# Patient Record
Sex: Female | Born: 1980 | Race: White | Hispanic: No | Smoking: Current every day smoker
Health system: Southern US, Community
[De-identification: ages and names within clinical notes are randomized; demographics above are authoritative.]

## PROBLEM LIST (undated history)

## (undated) DIAGNOSIS — F419 Anxiety disorder, unspecified: Secondary | ICD-10-CM

## (undated) DIAGNOSIS — F909 Attention-deficit hyperactivity disorder, unspecified type: Secondary | ICD-10-CM

## (undated) HISTORY — PX: TUBAL LIGATION: SHX77

## (undated) HISTORY — PX: OTHER SURGICAL HISTORY: SHX169

---
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
## 2001-04-04 ENCOUNTER — Other Ambulatory Visit: Admission: RE | Admit: 2001-04-04 | Discharge: 2001-04-04 | Payer: Self-pay | Admitting: Obstetrics and Gynecology

## 2007-03-22 ENCOUNTER — Ambulatory Visit (HOSPITAL_COMMUNITY): Admission: RE | Admit: 2007-03-22 | Discharge: 2007-03-22 | Payer: Self-pay | Admitting: Family Medicine

## 2010-06-12 ENCOUNTER — Encounter: Payer: Self-pay | Admitting: Internal Medicine

## 2011-01-10 ENCOUNTER — Other Ambulatory Visit (HOSPITAL_COMMUNITY): Payer: Self-pay | Admitting: Internal Medicine

## 2011-01-12 ENCOUNTER — Other Ambulatory Visit (HOSPITAL_COMMUNITY): Payer: Self-pay | Admitting: Internal Medicine

## 2011-01-12 DIAGNOSIS — Z139 Encounter for screening, unspecified: Secondary | ICD-10-CM

## 2011-01-31 ENCOUNTER — Ambulatory Visit (HOSPITAL_COMMUNITY): Payer: Self-pay

## 2011-02-03 ENCOUNTER — Ambulatory Visit (HOSPITAL_COMMUNITY): Payer: Self-pay

## 2013-02-10 ENCOUNTER — Ambulatory Visit (HOSPITAL_COMMUNITY)
Admission: RE | Admit: 2013-02-10 | Discharge: 2013-02-10 | Disposition: A | Discharge status: Home / Self Care | Payer: Medicaid Other | Source: Ambulatory Visit | Attending: Internal Medicine | Admitting: Internal Medicine

## 2013-02-10 ENCOUNTER — Other Ambulatory Visit (HOSPITAL_COMMUNITY): Payer: Self-pay | Admitting: Internal Medicine

## 2013-02-10 DIAGNOSIS — M7732 Calcaneal spur, left foot: Secondary | ICD-10-CM

## 2013-02-10 DIAGNOSIS — M7730 Calcaneal spur, unspecified foot: Secondary | ICD-10-CM | POA: Insufficient documentation

## 2015-10-06 ENCOUNTER — Other Ambulatory Visit (HOSPITAL_COMMUNITY): Payer: Self-pay | Admitting: Internal Medicine

## 2015-10-06 ENCOUNTER — Ambulatory Visit (HOSPITAL_COMMUNITY)
Admission: RE | Admit: 2015-10-06 | Discharge: 2015-10-06 | Disposition: A | Discharge status: Home / Self Care | Payer: Medicaid Other | Source: Ambulatory Visit | Attending: Internal Medicine | Admitting: Internal Medicine

## 2015-10-06 DIAGNOSIS — M79673 Pain in unspecified foot: Secondary | ICD-10-CM | POA: Insufficient documentation

## 2015-10-06 DIAGNOSIS — M79671 Pain in right foot: Secondary | ICD-10-CM

## 2017-11-30 IMAGING — DX DG FOOT COMPLETE 3+V*R*
3 series · 3 of 3 positions shown · non-contrast
Comparison: No recent prior.

CLINICAL DATA: Right foot pain.  Initial evaluation.

EXAM:
RIGHT FOOT COMPLETE - 3+ VIEW

[foot ap]
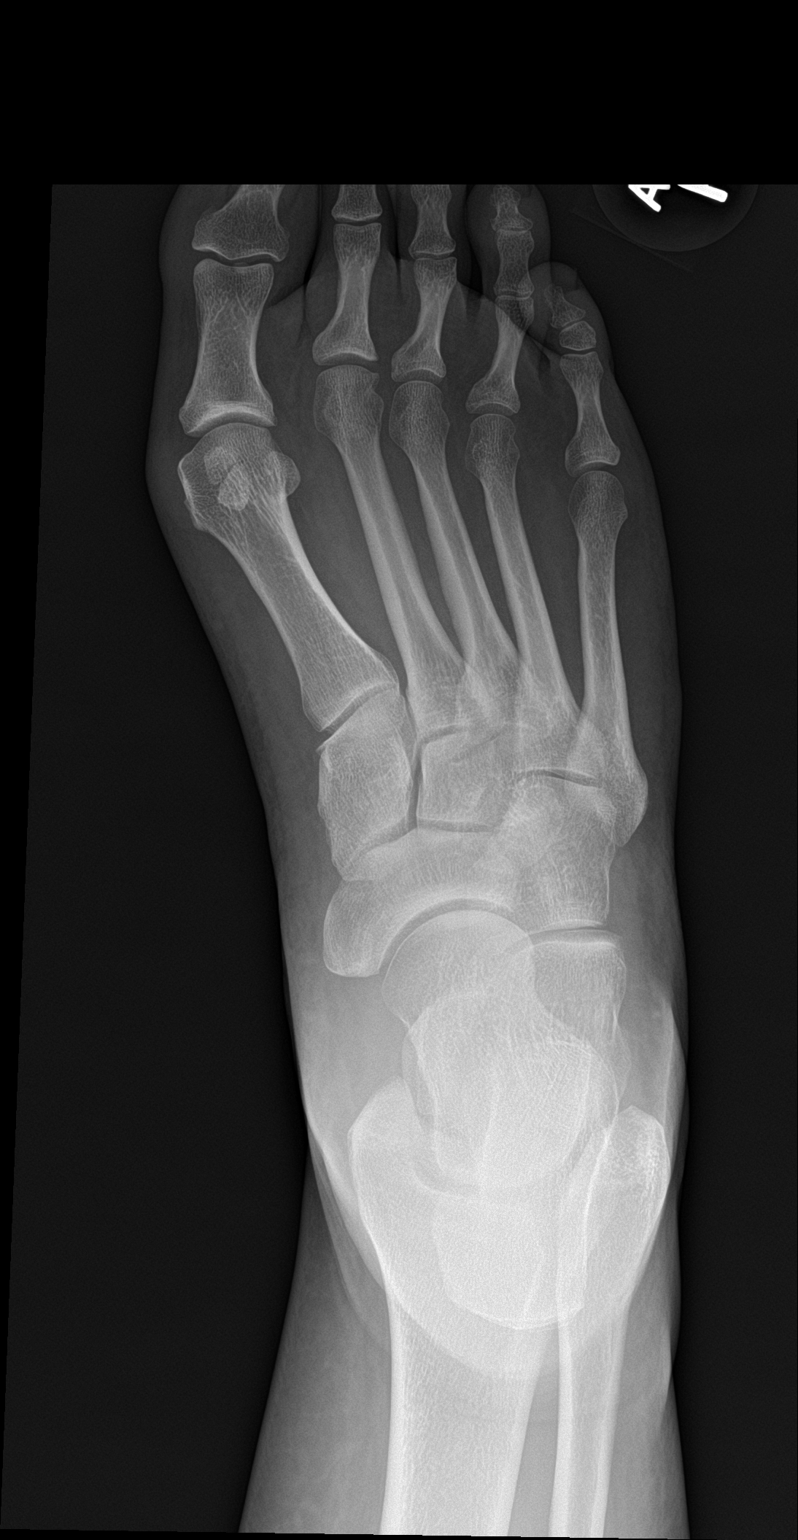

[foot obl]
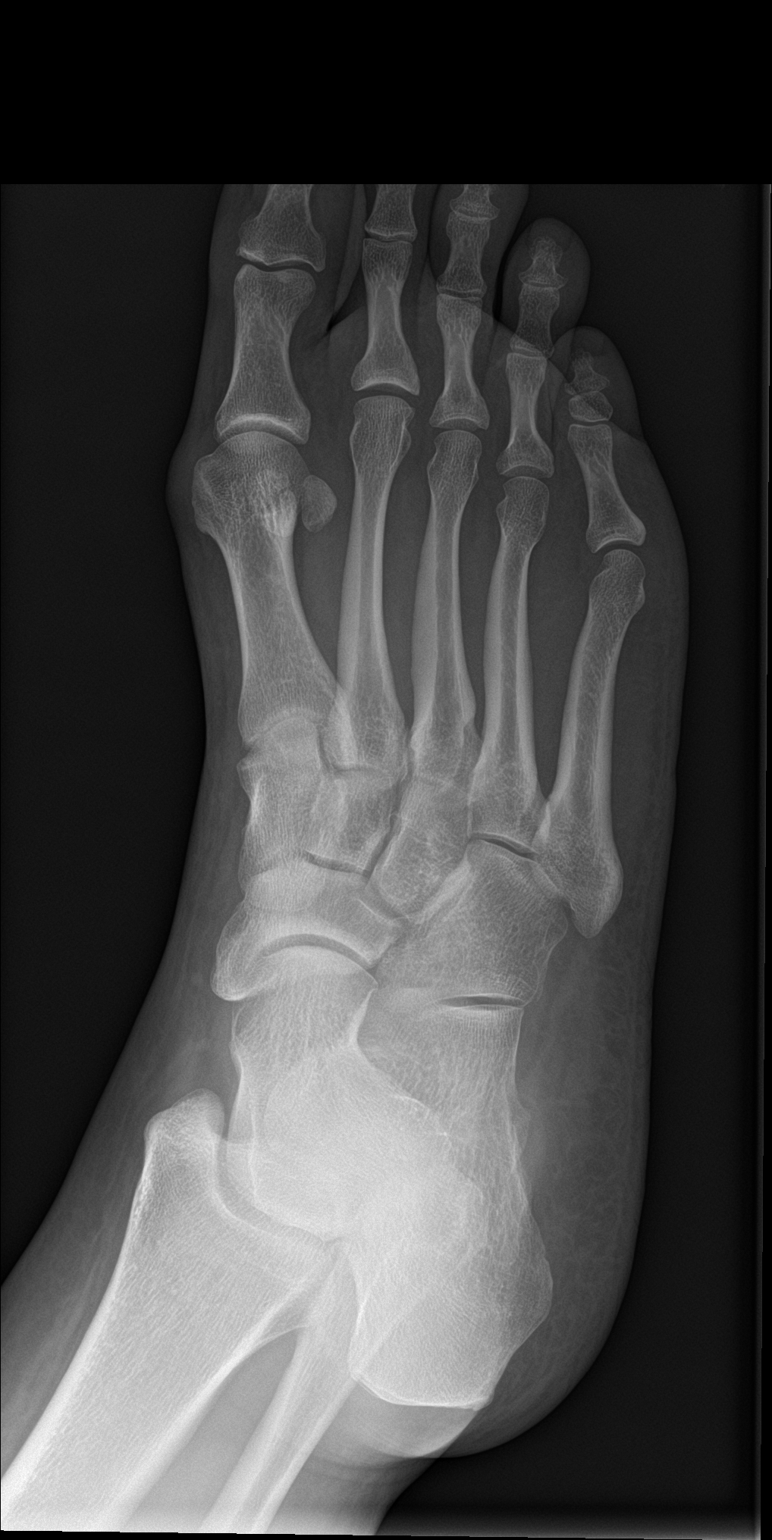

[foot lat]
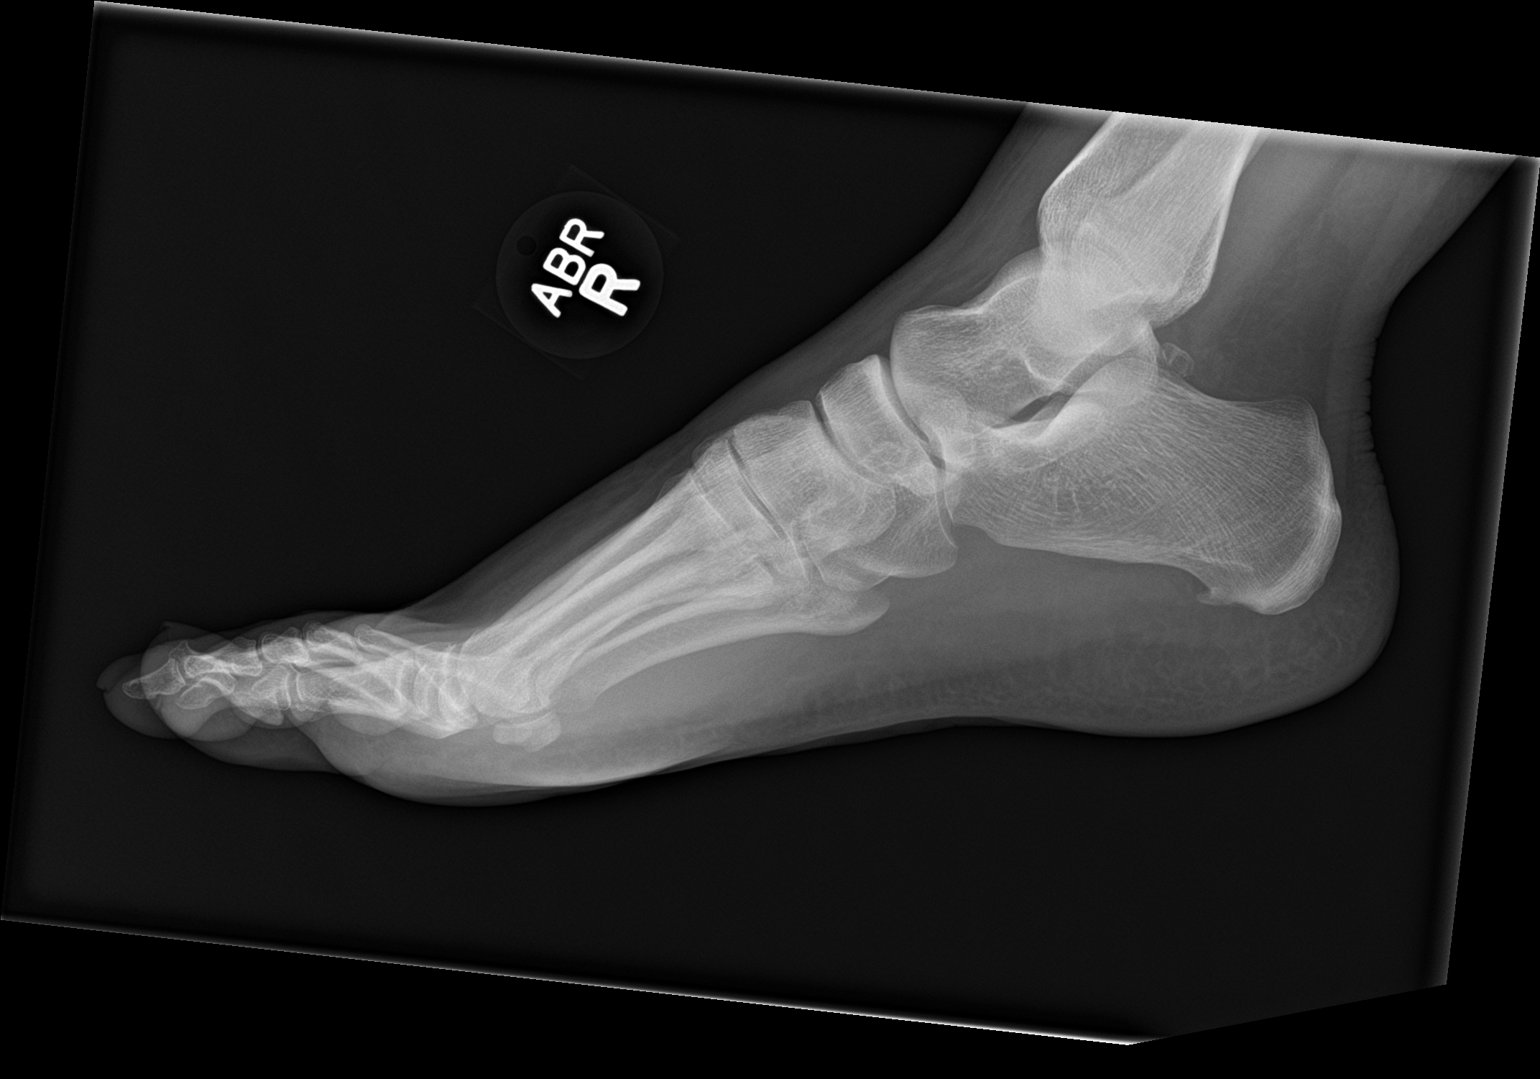

[3 of 3 positions shown; findings below may reference images not displayed]

FINDINGS: No acute bony or joint abnormality identified. No evidence of
fracture dislocation.
IMPRESSION: No acute abnormality.

## 2018-05-28 DIAGNOSIS — Z01411 Encounter for gynecological examination (general) (routine) with abnormal findings: Secondary | ICD-10-CM | POA: Diagnosis not present

## 2018-05-28 DIAGNOSIS — M779 Enthesopathy, unspecified: Secondary | ICD-10-CM | POA: Diagnosis not present

## 2018-05-28 DIAGNOSIS — G894 Chronic pain syndrome: Secondary | ICD-10-CM | POA: Diagnosis not present

## 2018-05-28 DIAGNOSIS — Z1389 Encounter for screening for other disorder: Secondary | ICD-10-CM | POA: Diagnosis not present

## 2018-05-28 DIAGNOSIS — E669 Obesity, unspecified: Secondary | ICD-10-CM | POA: Diagnosis not present

## 2018-05-28 DIAGNOSIS — K219 Gastro-esophageal reflux disease without esophagitis: Secondary | ICD-10-CM | POA: Diagnosis not present

## 2018-05-28 DIAGNOSIS — M7730 Calcaneal spur, unspecified foot: Secondary | ICD-10-CM | POA: Diagnosis not present

## 2018-05-28 DIAGNOSIS — Z6830 Body mass index (BMI) 30.0-30.9, adult: Secondary | ICD-10-CM | POA: Diagnosis not present

## 2018-05-28 DIAGNOSIS — F1120 Opioid dependence, uncomplicated: Secondary | ICD-10-CM | POA: Diagnosis not present

## 2018-05-28 DIAGNOSIS — F419 Anxiety disorder, unspecified: Secondary | ICD-10-CM | POA: Diagnosis not present

## 2018-05-29 DIAGNOSIS — Z124 Encounter for screening for malignant neoplasm of cervix: Secondary | ICD-10-CM | POA: Diagnosis not present

## 2018-06-28 DIAGNOSIS — M5417 Radiculopathy, lumbosacral region: Secondary | ICD-10-CM | POA: Diagnosis not present

## 2018-06-28 DIAGNOSIS — F329 Major depressive disorder, single episode, unspecified: Secondary | ICD-10-CM | POA: Diagnosis not present

## 2018-06-28 DIAGNOSIS — F419 Anxiety disorder, unspecified: Secondary | ICD-10-CM | POA: Diagnosis not present

## 2018-06-28 DIAGNOSIS — G894 Chronic pain syndrome: Secondary | ICD-10-CM | POA: Diagnosis not present

## 2018-06-28 DIAGNOSIS — Z6828 Body mass index (BMI) 28.0-28.9, adult: Secondary | ICD-10-CM | POA: Diagnosis not present

## 2018-06-28 DIAGNOSIS — F1120 Opioid dependence, uncomplicated: Secondary | ICD-10-CM | POA: Diagnosis not present

## 2018-06-28 DIAGNOSIS — Z1389 Encounter for screening for other disorder: Secondary | ICD-10-CM | POA: Diagnosis not present

## 2018-06-28 DIAGNOSIS — E669 Obesity, unspecified: Secondary | ICD-10-CM | POA: Diagnosis not present

## 2018-07-30 DIAGNOSIS — Z1389 Encounter for screening for other disorder: Secondary | ICD-10-CM | POA: Diagnosis not present

## 2018-07-30 DIAGNOSIS — M5124 Other intervertebral disc displacement, thoracic region: Secondary | ICD-10-CM | POA: Diagnosis not present

## 2018-07-30 DIAGNOSIS — Z6827 Body mass index (BMI) 27.0-27.9, adult: Secondary | ICD-10-CM | POA: Diagnosis not present

## 2018-07-30 DIAGNOSIS — M62838 Other muscle spasm: Secondary | ICD-10-CM | POA: Diagnosis not present

## 2018-07-30 DIAGNOSIS — G894 Chronic pain syndrome: Secondary | ICD-10-CM | POA: Diagnosis not present

## 2018-08-29 DIAGNOSIS — G894 Chronic pain syndrome: Secondary | ICD-10-CM | POA: Diagnosis not present

## 2018-08-29 DIAGNOSIS — E669 Obesity, unspecified: Secondary | ICD-10-CM | POA: Diagnosis not present

## 2018-08-29 DIAGNOSIS — F1120 Opioid dependence, uncomplicated: Secondary | ICD-10-CM | POA: Diagnosis not present

## 2018-08-29 DIAGNOSIS — Z1389 Encounter for screening for other disorder: Secondary | ICD-10-CM | POA: Diagnosis not present

## 2018-08-29 DIAGNOSIS — Z6830 Body mass index (BMI) 30.0-30.9, adult: Secondary | ICD-10-CM | POA: Diagnosis not present

## 2018-09-18 DIAGNOSIS — M5416 Radiculopathy, lumbar region: Secondary | ICD-10-CM | POA: Diagnosis not present

## 2018-10-24 DIAGNOSIS — M5416 Radiculopathy, lumbar region: Secondary | ICD-10-CM | POA: Diagnosis not present

## 2018-10-24 DIAGNOSIS — M545 Low back pain: Secondary | ICD-10-CM | POA: Diagnosis not present

## 2018-10-24 DIAGNOSIS — Z6830 Body mass index (BMI) 30.0-30.9, adult: Secondary | ICD-10-CM | POA: Diagnosis not present

## 2018-10-31 DIAGNOSIS — Z1389 Encounter for screening for other disorder: Secondary | ICD-10-CM | POA: Diagnosis not present

## 2018-10-31 DIAGNOSIS — Z6830 Body mass index (BMI) 30.0-30.9, adult: Secondary | ICD-10-CM | POA: Diagnosis not present

## 2018-10-31 DIAGNOSIS — E6609 Other obesity due to excess calories: Secondary | ICD-10-CM | POA: Diagnosis not present

## 2018-10-31 DIAGNOSIS — K219 Gastro-esophageal reflux disease without esophagitis: Secondary | ICD-10-CM | POA: Diagnosis not present

## 2018-10-31 DIAGNOSIS — M5126 Other intervertebral disc displacement, lumbar region: Secondary | ICD-10-CM | POA: Diagnosis not present

## 2018-10-31 DIAGNOSIS — G894 Chronic pain syndrome: Secondary | ICD-10-CM | POA: Diagnosis not present

## 2018-11-06 DIAGNOSIS — M545 Low back pain: Secondary | ICD-10-CM | POA: Diagnosis not present

## 2018-11-06 DIAGNOSIS — R293 Abnormal posture: Secondary | ICD-10-CM | POA: Diagnosis not present

## 2018-11-06 DIAGNOSIS — M5431 Sciatica, right side: Secondary | ICD-10-CM | POA: Diagnosis not present

## 2018-11-06 DIAGNOSIS — M5416 Radiculopathy, lumbar region: Secondary | ICD-10-CM | POA: Diagnosis not present

## 2018-11-07 DIAGNOSIS — R293 Abnormal posture: Secondary | ICD-10-CM | POA: Diagnosis not present

## 2018-11-07 DIAGNOSIS — M5431 Sciatica, right side: Secondary | ICD-10-CM | POA: Diagnosis not present

## 2018-11-07 DIAGNOSIS — M545 Low back pain: Secondary | ICD-10-CM | POA: Diagnosis not present

## 2018-11-07 DIAGNOSIS — M5416 Radiculopathy, lumbar region: Secondary | ICD-10-CM | POA: Diagnosis not present

## 2018-11-14 DIAGNOSIS — M545 Low back pain: Secondary | ICD-10-CM | POA: Diagnosis not present

## 2018-11-14 DIAGNOSIS — M5416 Radiculopathy, lumbar region: Secondary | ICD-10-CM | POA: Diagnosis not present

## 2018-11-14 DIAGNOSIS — M5431 Sciatica, right side: Secondary | ICD-10-CM | POA: Diagnosis not present

## 2018-11-14 DIAGNOSIS — R293 Abnormal posture: Secondary | ICD-10-CM | POA: Diagnosis not present

## 2018-12-02 DIAGNOSIS — G894 Chronic pain syndrome: Secondary | ICD-10-CM | POA: Diagnosis not present

## 2018-12-02 DIAGNOSIS — M797 Fibromyalgia: Secondary | ICD-10-CM | POA: Diagnosis not present

## 2018-12-02 DIAGNOSIS — Z1389 Encounter for screening for other disorder: Secondary | ICD-10-CM | POA: Diagnosis not present

## 2018-12-02 DIAGNOSIS — Z6831 Body mass index (BMI) 31.0-31.9, adult: Secondary | ICD-10-CM | POA: Diagnosis not present

## 2018-12-02 DIAGNOSIS — F1120 Opioid dependence, uncomplicated: Secondary | ICD-10-CM | POA: Diagnosis not present

## 2019-01-03 DIAGNOSIS — G894 Chronic pain syndrome: Secondary | ICD-10-CM | POA: Diagnosis not present

## 2019-01-03 DIAGNOSIS — Z6831 Body mass index (BMI) 31.0-31.9, adult: Secondary | ICD-10-CM | POA: Diagnosis not present

## 2019-01-03 DIAGNOSIS — M7730 Calcaneal spur, unspecified foot: Secondary | ICD-10-CM | POA: Diagnosis not present

## 2019-01-03 DIAGNOSIS — M5136 Other intervertebral disc degeneration, lumbar region: Secondary | ICD-10-CM | POA: Diagnosis not present

## 2019-02-04 DIAGNOSIS — K219 Gastro-esophageal reflux disease without esophagitis: Secondary | ICD-10-CM | POA: Diagnosis not present

## 2019-02-04 DIAGNOSIS — M797 Fibromyalgia: Secondary | ICD-10-CM | POA: Diagnosis not present

## 2019-02-04 DIAGNOSIS — G894 Chronic pain syndrome: Secondary | ICD-10-CM | POA: Diagnosis not present

## 2019-02-04 DIAGNOSIS — Z6832 Body mass index (BMI) 32.0-32.9, adult: Secondary | ICD-10-CM | POA: Diagnosis not present

## 2019-03-06 DIAGNOSIS — E6609 Other obesity due to excess calories: Secondary | ICD-10-CM | POA: Diagnosis not present

## 2019-03-06 DIAGNOSIS — Z6832 Body mass index (BMI) 32.0-32.9, adult: Secondary | ICD-10-CM | POA: Diagnosis not present

## 2019-03-06 DIAGNOSIS — K219 Gastro-esophageal reflux disease without esophagitis: Secondary | ICD-10-CM | POA: Diagnosis not present

## 2019-03-06 DIAGNOSIS — G894 Chronic pain syndrome: Secondary | ICD-10-CM | POA: Diagnosis not present

## 2019-04-03 DIAGNOSIS — G894 Chronic pain syndrome: Secondary | ICD-10-CM | POA: Diagnosis not present

## 2019-04-30 DIAGNOSIS — G894 Chronic pain syndrome: Secondary | ICD-10-CM | POA: Diagnosis not present

## 2021-03-23 DIAGNOSIS — F329 Major depressive disorder, single episode, unspecified: Secondary | ICD-10-CM | POA: Diagnosis not present

## 2021-03-23 DIAGNOSIS — Z6821 Body mass index (BMI) 21.0-21.9, adult: Secondary | ICD-10-CM | POA: Diagnosis not present

## 2021-03-23 DIAGNOSIS — Z13220 Encounter for screening for lipoid disorders: Secondary | ICD-10-CM | POA: Diagnosis not present

## 2021-03-23 DIAGNOSIS — Z9229 Personal history of other drug therapy: Secondary | ICD-10-CM | POA: Diagnosis not present

## 2021-03-23 DIAGNOSIS — G894 Chronic pain syndrome: Secondary | ICD-10-CM | POA: Diagnosis not present

## 2021-03-23 DIAGNOSIS — K219 Gastro-esophageal reflux disease without esophagitis: Secondary | ICD-10-CM | POA: Diagnosis not present

## 2021-03-23 DIAGNOSIS — R634 Abnormal weight loss: Secondary | ICD-10-CM | POA: Diagnosis not present

## 2021-03-23 DIAGNOSIS — F419 Anxiety disorder, unspecified: Secondary | ICD-10-CM | POA: Diagnosis not present

## 2021-04-21 DIAGNOSIS — F419 Anxiety disorder, unspecified: Secondary | ICD-10-CM | POA: Diagnosis not present

## 2021-04-21 DIAGNOSIS — G894 Chronic pain syndrome: Secondary | ICD-10-CM | POA: Diagnosis not present

## 2021-04-21 DIAGNOSIS — F329 Major depressive disorder, single episode, unspecified: Secondary | ICD-10-CM | POA: Diagnosis not present

## 2021-05-30 DIAGNOSIS — Z6821 Body mass index (BMI) 21.0-21.9, adult: Secondary | ICD-10-CM | POA: Diagnosis not present

## 2021-05-30 DIAGNOSIS — E538 Deficiency of other specified B group vitamins: Secondary | ICD-10-CM | POA: Diagnosis not present

## 2021-05-30 DIAGNOSIS — F4321 Adjustment disorder with depressed mood: Secondary | ICD-10-CM | POA: Diagnosis not present

## 2021-05-30 DIAGNOSIS — G894 Chronic pain syndrome: Secondary | ICD-10-CM | POA: Diagnosis not present

## 2021-06-03 ENCOUNTER — Encounter: Payer: Self-pay | Admitting: Obstetrics & Gynecology

## 2021-06-24 DIAGNOSIS — F419 Anxiety disorder, unspecified: Secondary | ICD-10-CM | POA: Diagnosis not present

## 2021-06-24 DIAGNOSIS — G894 Chronic pain syndrome: Secondary | ICD-10-CM | POA: Diagnosis not present

## 2021-07-04 ENCOUNTER — Other Ambulatory Visit (HOSPITAL_COMMUNITY)
Admission: RE | Admit: 2021-07-04 | Discharge: 2021-07-04 | Disposition: A | Discharge status: Home / Self Care | Payer: Medicare HMO | Source: Ambulatory Visit | Attending: Obstetrics & Gynecology | Admitting: Obstetrics & Gynecology

## 2021-07-04 ENCOUNTER — Other Ambulatory Visit: Payer: Self-pay

## 2021-07-04 ENCOUNTER — Ambulatory Visit (INDEPENDENT_AMBULATORY_CARE_PROVIDER_SITE_OTHER): Payer: Medicare HMO | Admitting: Obstetrics & Gynecology

## 2021-07-04 ENCOUNTER — Encounter: Payer: Self-pay | Admitting: Obstetrics & Gynecology

## 2021-07-04 VITALS — BP 114/79 | HR 63 | Ht 64.0 in | Wt 125.0 lb

## 2021-07-04 DIAGNOSIS — N912 Amenorrhea, unspecified: Secondary | ICD-10-CM

## 2021-07-04 DIAGNOSIS — Z113 Encounter for screening for infections with a predominantly sexual mode of transmission: Secondary | ICD-10-CM | POA: Insufficient documentation

## 2021-07-04 DIAGNOSIS — N898 Other specified noninflammatory disorders of vagina: Secondary | ICD-10-CM | POA: Insufficient documentation

## 2021-07-04 DIAGNOSIS — N926 Irregular menstruation, unspecified: Secondary | ICD-10-CM

## 2021-07-04 LAB — POCT URINE PREGNANCY: Preg Test, Ur: NEGATIVE

## 2021-07-04 MED ORDER — MEDROXYPROGESTERONE ACETATE 10 MG PO TABS: 10.0000 mg | ORAL_TABLET | Freq: Every day | ORAL | 0 refills | Status: DC

## 2021-07-04 NOTE — Progress Notes (Signed)
Chief Complaint  Patient presents with   Amenorrhea    No period since June. Hx of BTL      41 y.o. [id number] No LMP recorded (lmp unknown). The current method of family planning is tubal ligation.  Outpatient Encounter Medications as of 07/04/2021  Medication Sig   ALPRAZolam (XANAX) 0.5 MG tablet Take 0.5 mg by mouth 3 (three) times daily as needed.   DULoxetine (CYMBALTA) 60 MG capsule Take 60 mg by mouth daily.   HYDROcodone-acetaminophen (NORCO) 10-325 MG tablet Take by mouth.   medroxyPROGESTERone (PROVERA) 10 MG tablet Take 1 tablet (10 mg total) by mouth daily.   No facility-administered encounter medications on file as of 07/04/2021.    Subjective Referred from [place] No menses since bleeding for 30 days last JUne Never done this before No meds changes No breast discharge TSH was normal   Had a normal Pap 1 year ago  History reviewed. No pertinent past medical history.  Past Surgical History:  Procedure Laterality Date   TUBAL LIGATION      OB History     Gravida  4   Para  1   Term  1   Preterm      AB  3   Living  1      SAB      IAB      Ectopic      Multiple      Live Births              Allergies  Allergen Reactions   Ibuprofen     Social History   Socioeconomic History   Marital status: Divorced    Spouse name: Not on file   Number of children: Not on file   Years of education: Not on file   Highest education level: Not on file  Occupational History   Not on file  Tobacco Use   Smoking status: Every Day    Types: Cigarettes   Smokeless tobacco: Never  Vaping Use   Vaping Use: Never used  Substance and Sexual Activity   Alcohol use: Not Currently   Drug use: Not Currently   Sexual activity: Yes    Birth control/protection: Surgical  Other Topics Concern   Not on file  Social History Narrative   Not on file   Social Determinants of Health   Financial Resource Strain: Not on file   Food Insecurity: Not on file  Transportation Needs: Not on file  Physical Activity: Not on file  Stress: Not on file  Social Connections: Not on file    History reviewed. No pertinent family history.  Medications:       Current Outpatient Medications:    ALPRAZolam (XANAX) 0.5 MG tablet, Take 0.5 mg by mouth 3 (three) times daily as needed., Disp: , Rfl:    DULoxetine (CYMBALTA) 60 MG capsule, Take 60 mg by mouth daily., Disp: , Rfl:    HYDROcodone-acetaminophen (NORCO) 10-325 MG tablet, Take by mouth., Disp: , Rfl:    medroxyPROGESTERone (PROVERA) 10 MG tablet, Take 1 tablet (10 mg total) by mouth daily., Disp: 10 tablet, Rfl: 0  Objective Blood pressure 114/79, pulse 63, height 5\' 4"  (1.626 m), weight 125 lb (56.7 kg).  General WDWN female NAD Vulva:  normal appearing vulva with no masses, tenderness or lesions Vagina:  normal mucosa, no discharge Cervix:  Normal no lesions Uterus:  normal size, contour, position, consistency, mobility, non-tender Adnexa: ovaries:present,  normal adnexa  in size, nontender and no masses   Pertinent ROS No burning with urination, frequency or urgency No nausea, vomiting or diarrhea Nor fever chills or other constitutional symptoms   Labs or studies Reviewed labs from [place] Diagnoses this Encounter::   ICD-10-CM   1. Amenorrhea, secondary  N91.2 Prolactin    2. Missed period  N92.6 POCT urine pregnancy    CANCELED: POC Urinalysis Dipstick OB      Established relevant diagnosis(es):   Plan/Recommendations: Meds ordered this encounter  Medications   medroxyPROGESTERone (PROVERA) 10 MG tablet    Sig: Take 1 tablet (10 mg total) by mouth daily.    Dispense:  10 tablet    Refill:  0    Labs or Scans Ordered: Orders Placed This Encounter  Procedures   Prolactin   POCT urine pregnancy    Management:: PCT Prolactin TSH normal  Follow up Return in about 3 weeks (around 07/25/2021) for Follow up, with Dr  Elonda Husky.     All questions were answered.

## 2021-07-05 LAB — CERVICOVAGINAL ANCILLARY ONLY
Bacterial Vaginitis (gardnerella): POSITIVE — AB
Candida Glabrata: NEGATIVE
Candida Vaginitis: NEGATIVE
Chlamydia: POSITIVE — AB
Comment: NEGATIVE
Comment: NEGATIVE
Comment: NEGATIVE
Comment: NEGATIVE
Comment: NEGATIVE
Comment: NORMAL
Neisseria Gonorrhea: NEGATIVE
Trichomonas: NEGATIVE

## 2021-07-05 LAB — PROLACTIN: Prolactin: 14.9 ng/mL (ref 4.8–23.3)

## 2021-07-06 ENCOUNTER — Telehealth: Payer: Self-pay | Admitting: Obstetrics & Gynecology

## 2021-07-06 ENCOUNTER — Encounter: Payer: Self-pay | Admitting: Adult Health

## 2021-07-06 ENCOUNTER — Telehealth: Payer: Self-pay | Admitting: Adult Health

## 2021-07-06 DIAGNOSIS — A749 Chlamydial infection, unspecified: Secondary | ICD-10-CM

## 2021-07-06 HISTORY — DX: Chlamydial infection, unspecified: A74.9

## 2021-07-06 MED ORDER — METRONIDAZOLE 500 MG PO TABS: 500.0000 mg | ORAL_TABLET | Freq: Two times a day (BID) | ORAL | 0 refills | Status: DC

## 2021-07-06 MED ORDER — DOXYCYCLINE HYCLATE 100 MG PO TABS: 100.0000 mg | ORAL_TABLET | Freq: Two times a day (BID) | ORAL | 0 refills | Status: DC

## 2021-07-06 NOTE — Telephone Encounter (Signed)
Patient has already spoken with Anderson [place].

## 2021-07-06 NOTE — Telephone Encounter (Signed)
Pt aware +chlamydia and BV and on CV swab will rx doxycycline and flagyl, no sex or alcohol during treatment. If has sex after treatment, use condoms, can do POC at Follow up visit. [place] sent  Will treat partner Beth Jefferson [birth year] with doxycycline 10 mg 1 bid x 7 days, sent to [facility].

## 2021-07-06 NOTE — Telephone Encounter (Signed)
Patient wants to talk to somebody about her lab results. Please advise.

## 2021-07-25 ENCOUNTER — Other Ambulatory Visit: Payer: Self-pay

## 2021-07-25 ENCOUNTER — Encounter: Payer: Self-pay | Admitting: Obstetrics & Gynecology

## 2021-07-25 ENCOUNTER — Ambulatory Visit (INDEPENDENT_AMBULATORY_CARE_PROVIDER_SITE_OTHER): Payer: Medicare HMO | Admitting: Obstetrics & Gynecology

## 2021-07-25 VITALS — BP 111/75 | HR 90 | Ht 65.0 in | Wt 123.0 lb

## 2021-07-25 DIAGNOSIS — G894 Chronic pain syndrome: Secondary | ICD-10-CM | POA: Diagnosis not present

## 2021-07-25 DIAGNOSIS — F32A Depression, unspecified: Secondary | ICD-10-CM | POA: Diagnosis not present

## 2021-07-25 DIAGNOSIS — F419 Anxiety disorder, unspecified: Secondary | ICD-10-CM | POA: Diagnosis not present

## 2021-07-25 DIAGNOSIS — Z6820 Body mass index (BMI) 20.0-20.9, adult: Secondary | ICD-10-CM | POA: Diagnosis not present

## 2021-07-25 DIAGNOSIS — N911 Secondary amenorrhea: Secondary | ICD-10-CM

## 2021-07-25 DIAGNOSIS — A749 Chlamydial infection, unspecified: Secondary | ICD-10-CM

## 2021-07-25 DIAGNOSIS — E538 Deficiency of other specified B group vitamins: Secondary | ICD-10-CM | POA: Diagnosis not present

## 2021-07-25 MED ORDER — FLUCONAZOLE 150 MG PO TABS: ORAL_TABLET | ORAL | 1 refills | Status: DC

## 2021-07-25 NOTE — Progress Notes (Signed)
? ? ? ? ?Chief Complaint  ?Patient presents with  ? Follow-up  ?  Provera didn't bring on a period; requests Diflucan for a yeast infection; POC CHL(done on urine)  ? ? ? ? ?41 y.o. [id number] No LMP recorded (lmp unknown). The current method of family planning is tubal ligation. ? ?Outpatient Encounter Medications as of 07/25/2021  ?Medication Sig  ? ALPRAZolam (XANAX) 0.5 MG tablet Take 0.5 mg by mouth 3 (three) times daily as needed.  ? Cyanocobalamin (VITAMIN B-12 IJ) Inject as directed every 30 (thirty) days.  ? DULoxetine (CYMBALTA) 60 MG capsule Take 90 mg by mouth daily.  ? fluconazole (DIFLUCAN) 150 MG tablet Take 1 tablet today and repeat in 3 days  ? HYDROcodone-acetaminophen (NORCO) 10-325 MG tablet Take by mouth.  ? VITAMIN D PO Take by mouth.  ? [DISCONTINUED] HYDROcodone-acetaminophen (NORCO) 10-325 MG tablet Take 1 tablet by mouth every 4 (four) hours as needed.  ? [DISCONTINUED] doxycycline (VIBRA-TABS) 100 MG tablet Take 1 tablet (100 mg total) by mouth 2 (two) times daily.  ? [DISCONTINUED] medroxyPROGESTERone (PROVERA) 10 MG tablet Take 1 tablet (10 mg total) by mouth daily.  ? [DISCONTINUED] metroNIDAZOLE (FLAGYL) 500 MG tablet Take 1 tablet (500 mg total) by mouth 2 (two) times daily.  ? ?No facility-administered encounter medications on file as of 07/25/2021.  ? ? ?Subjective ?Pt with not a drop of blood from PCT ?Prolactin normal ?No new meds confirmed ? ?Will assess ovarian function with FSH ? ?Past Medical History:  ?Diagnosis Date  ? Chlamydia 07/06/2021  ? 07/06/21 treated with doxycycline, poc________  ? ? ?Past Surgical History:  ?Procedure Laterality Date  ? TUBAL LIGATION    ? ? ?OB History   ? ? Gravida  ?4  ? Para  ?1  ? Term  ?1  ? Preterm  ?   ? AB  ?3  ? Living  ?1  ?  ? ? SAB  ?   ? IAB  ?   ? Ectopic  ?   ? Multiple  ?   ? Live Births  ?   ?   ?  ?  ? ? ?Allergies  ?Allergen Reactions  ? Ibuprofen   ? ? ?Social History  ? ?Socioeconomic History  ? Marital status: Divorced  ?  Spouse  name: Not on file  ? Number of children: Not on file  ? Years of education: Not on file  ? Highest education level: Not on file  ?Occupational History  ? Not on file  ?Tobacco Use  ? Smoking status: Every Day  ?  Packs/day: 1.00  ?  Years: 20.00  ?  Pack years: 20.00  ?  Types: Cigarettes  ? Smokeless tobacco: Never  ?Vaping Use  ? Vaping Use: Never used  ?Substance and Sexual Activity  ? Alcohol use: Not Currently  ? Drug use: Not Currently  ? Sexual activity: Not Currently  ?  Birth control/protection: Surgical  ?  Comment: tubal  ?Other Topics Concern  ? Not on file  ?Social History Narrative  ? Not on file  ? ?Social Determinants of Health  ? ?Financial Resource Strain: Low Risk   ? Difficulty of Paying Living Expenses: Not very hard  ?Food Insecurity: No Food Insecurity  ? Worried About Charity fundraiser in the Last Year: Never true  ? Ran Out of Food in the Last Year: Never true  ?Transportation Needs: No Transportation Needs  ? Lack of Transportation (Medical): No  ?  Lack of Transportation (Non-Medical): No  ?Physical Activity: Insufficiently Active  ? Days of Exercise per Week: 2 days  ? Minutes of Exercise per Session: 10 min  ?Stress: Stress Concern Present  ? Feeling of Stress : Very much  ?Social Connections: Moderately Isolated  ? Frequency of Communication with Friends and Family: Twice a week  ? Frequency of Social Gatherings with Friends and Family: Once a week  ? Attends Religious Services: 1 to 4 times per year  ? Active Member of Clubs or Organizations: No  ? Attends Archivist Meetings: Never  ? Marital Status: Divorced  ? ? ?Family History  ?Problem Relation Age of Onset  ? Alcohol abuse Paternal Grandfather   ? Diabetes Paternal Grandmother   ? Heart disease Paternal Grandmother   ? Cancer Maternal Grandmother   ?     lung  ? Breast cancer Maternal Grandmother   ? Healthy Father   ? Macular degeneration Mother   ? COPD Brother   ? Bipolar disorder Sister   ? Schizophrenia Sister    ? ? ?Medications:       ?Current Outpatient Medications:  ?  ALPRAZolam (XANAX) 0.5 MG tablet, Take 0.5 mg by mouth 3 (three) times daily as needed., Disp: , Rfl:  ?  Cyanocobalamin (VITAMIN B-12 IJ), Inject as directed every 30 (thirty) days., Disp: , Rfl:  ?  DULoxetine (CYMBALTA) 60 MG capsule, Take 90 mg by mouth daily., Disp: , Rfl:  ?  fluconazole (DIFLUCAN) 150 MG tablet, Take 1 tablet today and repeat in 3 days, Disp: 2 tablet, Rfl: 1 ?  HYDROcodone-acetaminophen (NORCO) 10-325 MG tablet, Take by mouth., Disp: , Rfl:  ?  VITAMIN D PO, Take by mouth., Disp: , Rfl:  ? ?Objective ?Blood pressure 111/75, pulse 90, height '5\' 5"'$  (1.651 m), weight 123 lb (55.8 kg). ? ?Gen WDWN NAD ? ?Pertinent ROS ?No burning with urination, frequency or urgency ?No nausea, vomiting or diarrhea ?Nor fever chills or other constitutional symptoms ? ? ?Labs or studies ?reviewed ? ? ? ?Impression ?Diagnoses this Encounter:: ?  ICD-10-CM   ?1. Amenorrhea, secondary  N91.1 FSH  ? check FSH: negative PCT, normal prolactin  ?  ?2. Chlamydia  A74.9 GC/Chlamydia Probe Amp  ?  ? ? ?Established relevant diagnosis(es): ? ? ?Plan/Recommendations: ?Meds ordered this encounter  ?Medications  ? fluconazole (DIFLUCAN) 150 MG tablet  ?  Sig: Take 1 tablet today and repeat in 3 days  ?  Dispense:  2 tablet  ?  Refill:  1  ? ? ?Labs or Scans Ordered: ?Orders Placed This Encounter  ?Procedures  ? GC/Chlamydia Probe Amp  ? [place]  ? ? ?Management:: ?As above ? ?Follow up ?Return if symptoms worsen or fail to improve, for will contact regarding lab results. ? ? ? ? ? All questions were answered. ? ? ?

## 2021-07-26 LAB — FOLLICLE STIMULATING HORMONE: FSH: 115 m[IU]/mL

## 2021-07-27 LAB — GC/CHLAMYDIA PROBE AMP
Chlamydia trachomatis, NAA: NEGATIVE
Neisseria Gonorrhoeae by PCR: NEGATIVE

## 2021-08-02 DIAGNOSIS — G894 Chronic pain syndrome: Secondary | ICD-10-CM | POA: Diagnosis not present

## 2021-08-02 DIAGNOSIS — F419 Anxiety disorder, unspecified: Secondary | ICD-10-CM | POA: Diagnosis not present

## 2021-08-02 DIAGNOSIS — L309 Dermatitis, unspecified: Secondary | ICD-10-CM | POA: Diagnosis not present

## 2021-08-25 DIAGNOSIS — G894 Chronic pain syndrome: Secondary | ICD-10-CM | POA: Diagnosis not present

## 2021-08-25 DIAGNOSIS — R634 Abnormal weight loss: Secondary | ICD-10-CM | POA: Diagnosis not present

## 2021-08-25 DIAGNOSIS — E538 Deficiency of other specified B group vitamins: Secondary | ICD-10-CM | POA: Diagnosis not present

## 2021-08-25 DIAGNOSIS — R1110 Vomiting, unspecified: Secondary | ICD-10-CM | POA: Diagnosis not present

## 2021-08-25 DIAGNOSIS — Z0001 Encounter for general adult medical examination with abnormal findings: Secondary | ICD-10-CM | POA: Diagnosis not present

## 2021-08-25 DIAGNOSIS — E46 Unspecified protein-calorie malnutrition: Secondary | ICD-10-CM | POA: Diagnosis not present

## 2021-08-25 DIAGNOSIS — Z6822 Body mass index (BMI) 22.0-22.9, adult: Secondary | ICD-10-CM | POA: Diagnosis not present

## 2021-08-25 DIAGNOSIS — K219 Gastro-esophageal reflux disease without esophagitis: Secondary | ICD-10-CM | POA: Diagnosis not present

## 2021-08-25 DIAGNOSIS — R6881 Early satiety: Secondary | ICD-10-CM | POA: Diagnosis not present

## 2021-10-26 DIAGNOSIS — G894 Chronic pain syndrome: Secondary | ICD-10-CM | POA: Diagnosis not present

## 2021-10-26 DIAGNOSIS — F419 Anxiety disorder, unspecified: Secondary | ICD-10-CM | POA: Diagnosis not present

## 2021-10-26 DIAGNOSIS — E538 Deficiency of other specified B group vitamins: Secondary | ICD-10-CM | POA: Diagnosis not present

## 2021-10-26 DIAGNOSIS — F32A Depression, unspecified: Secondary | ICD-10-CM | POA: Diagnosis not present

## 2022-01-24 DIAGNOSIS — R102 Pelvic and perineal pain: Secondary | ICD-10-CM | POA: Diagnosis not present

## 2022-01-24 DIAGNOSIS — F909 Attention-deficit hyperactivity disorder, unspecified type: Secondary | ICD-10-CM | POA: Diagnosis not present

## 2022-01-24 DIAGNOSIS — F419 Anxiety disorder, unspecified: Secondary | ICD-10-CM | POA: Diagnosis not present

## 2022-01-24 DIAGNOSIS — G894 Chronic pain syndrome: Secondary | ICD-10-CM | POA: Diagnosis not present

## 2022-01-25 ENCOUNTER — Other Ambulatory Visit (HOSPITAL_COMMUNITY): Payer: Self-pay | Admitting: Internal Medicine

## 2022-01-25 ENCOUNTER — Other Ambulatory Visit: Payer: Self-pay | Admitting: Internal Medicine

## 2022-01-25 DIAGNOSIS — R102 Pelvic and perineal pain: Secondary | ICD-10-CM

## 2022-02-02 ENCOUNTER — Ambulatory Visit (HOSPITAL_COMMUNITY)
Admission: RE | Admit: 2022-02-02 | Discharge: 2022-02-02 | Disposition: A | Discharge status: Home / Self Care | Payer: Medicare HMO | Source: Ambulatory Visit | Attending: Internal Medicine | Admitting: Internal Medicine

## 2022-02-02 DIAGNOSIS — N83202 Unspecified ovarian cyst, left side: Secondary | ICD-10-CM | POA: Diagnosis not present

## 2022-02-02 DIAGNOSIS — R102 Pelvic and perineal pain: Secondary | ICD-10-CM | POA: Diagnosis not present

## 2022-02-17 ENCOUNTER — Ambulatory Visit: Payer: Medicare HMO | Admitting: Obstetrics & Gynecology

## 2022-02-22 ENCOUNTER — Other Ambulatory Visit (HOSPITAL_COMMUNITY)
Admission: RE | Admit: 2022-02-22 | Discharge: 2022-02-22 | Disposition: A | Discharge status: Home / Self Care | Payer: Medicare HMO | Source: Ambulatory Visit | Attending: Obstetrics & Gynecology | Admitting: Obstetrics & Gynecology

## 2022-02-22 ENCOUNTER — Ambulatory Visit (INDEPENDENT_AMBULATORY_CARE_PROVIDER_SITE_OTHER): Payer: Medicare HMO | Admitting: Obstetrics & Gynecology

## 2022-02-22 ENCOUNTER — Encounter: Payer: Self-pay | Admitting: Obstetrics & Gynecology

## 2022-02-22 VITALS — BP 125/76 | HR 66 | Ht 65.0 in | Wt 157.2 lb

## 2022-02-22 DIAGNOSIS — A5602 Chlamydial vulvovaginitis: Secondary | ICD-10-CM | POA: Diagnosis not present

## 2022-02-22 DIAGNOSIS — Z01419 Encounter for gynecological examination (general) (routine) without abnormal findings: Secondary | ICD-10-CM | POA: Insufficient documentation

## 2022-02-22 DIAGNOSIS — N760 Acute vaginitis: Secondary | ICD-10-CM | POA: Insufficient documentation

## 2022-02-22 DIAGNOSIS — Z124 Encounter for screening for malignant neoplasm of cervix: Secondary | ICD-10-CM | POA: Diagnosis not present

## 2022-02-22 DIAGNOSIS — N941 Unspecified dyspareunia: Secondary | ICD-10-CM

## 2022-02-22 DIAGNOSIS — N83202 Unspecified ovarian cyst, left side: Secondary | ICD-10-CM | POA: Diagnosis not present

## 2022-02-22 DIAGNOSIS — Z1151 Encounter for screening for human papillomavirus (HPV): Secondary | ICD-10-CM | POA: Insufficient documentation

## 2022-02-22 DIAGNOSIS — N9489 Other specified conditions associated with female genital organs and menstrual cycle: Secondary | ICD-10-CM

## 2022-02-22 DIAGNOSIS — N951 Menopausal and female climacteric states: Secondary | ICD-10-CM

## 2022-02-22 DIAGNOSIS — R102 Pelvic and perineal pain: Secondary | ICD-10-CM

## 2022-02-22 NOTE — Progress Notes (Signed)
GYN VISIT Patient name: Beth Jefferson MRN [id number]  Date of birth: Feb 09, 1981 Chief Complaint:   Follow-up (On ultrasound results)  History of Present Illness:   Beth Jefferson is a 41 y.o. [phone] female being seen today for the following concerns:  Pelvic pain: This has been an ongoing issue for a year per pt.  She states pain is nagging/aching feeling that she feels daily, but does come and go.  Pain is spontaneous, rates the pain 7/10.  Unable to take ibuprofen, taking tylenol with minimal improvement.  Due to pain, [place] completed.  Of note, pt states that she is here to discuss her ovary and also the lesion in her uterus.  She also notes considerable dyspareunia.  Recent US completed 02/02/2022: 8.7 by 4.2 x 5 cm = volume: 95.6 mL. No fibroids or other mass visualized.  Normal right ovary.  Left ovary: Large complex cystic mass measuring 4.9 x 4.1 x 4.3 cm, contains avascular echogenic mural nodule measuring 17 x 12 x 15 mm. There is adjacent cyst versus septation.  Endometrium 28m.   Endometrial versus sub mucosal hypoechoic mass with cystic changes measuring 2.2 by 2.2 x 2 cm.   Last period has been about a 1-2 yrs ago.  Seen by Dr. EElonda Huskyin March for work up regarding amenorrhea- [place], which is now consistent with menopause.  Denies postcoital or irregular bleeding.  Denies vaginal discharge, itching or irritation.        07/04/2021   11:28 AM  Depression screen PHQ 2/9  Decreased Interest 0  Down, Depressed, Hopeless 0  PHQ - 2 Score 0     Review of Systems:   Pertinent items are noted in HPI Denies fever/chills, dizziness, headaches, visual disturbances, fatigue, shortness of breath, chest pain or vomiting.  Some nausea, no vomiting.  Denies change in bowel habits.  Denies urinary concerns. Pertinent History Reviewed:  Reviewed past medical,surgical, social, obstetrical and family history.  Reviewed problem list, medications and allergies. Physical Assessment:    Vitals:   02/22/22 1003  BP: 125/76  Pulse: 66  Weight: 157 lb 3.2 oz (71.3 kg)  Height: '5\' 5"'$  (1.651 m)  Body mass index is 26.16 kg/m.       Physical Examination:   General appearance: alert, well appearing, and in no distress  Psych: mood appropriate, normal affect  Skin: warm & dry   Cardiovascular: normal heart rate noted  Respiratory: normal respiratory effort, no distress  Abdomen: soft, no rebound, no guarding, notes pain LLQ  Pelvic: VULVA: normal appearing vulva with no masses, tenderness or lesions, VAGINA: normal appearing vagina with normal color and discharge, no lesions, CERVIX: normal appearing cervix without discharge or lesions, UTERUS: uterus is normal size, shape, consistency and nontender, ADNEXA: difficult to assess due to patient discomfort during exam  Extremities: no edema, no calf tenderness bilaterally  Chaperone:  declined, pt had partner present for exam     Assessment & Plan:  1) Ovarian mass, Pelvic pain -UKoreaimages separately reviewed, ORADs- Cat 2 -while likely benign would recommend surgical intervention -discussed laparoscopic bilateral salpingectomy and left oophorectomy -ROMA ordered- agreeable to complete in our office -pt does not want surgery with our office and prefers [facility] 2) Menopause -discussed that by definition, pt meets menopausal status -reviewed that while average age is 42yo can occur for women in their 456s-reviewed common symptoms- notes tolerable hot flashes  3) Endometrial lesion -pt had  questions regarding a hysterectomy -did not think that would be the next step -Reviewed that this was likely a benign finding- would consider SHG in a few mos or EMB should she ever have irregular/postmenopausal bleeding  4) Preventive screening -pap collected today, reviewed ASCCP guidelines -in review of old records, prior pap was not on file   Orders Placed This Encounter   Procedures   Ovarian Malignancy Risk-ROMA    Return for does not plan on returning- desires 2nd opinion.   Janyth Pupa, DO Attending [place], [facility] for [employer], [place]

## 2022-02-23 LAB — OVARIAN MALIGNANCY RISK-ROMA
Cancer Antigen (CA) 125: 9.2 U/mL (ref 0.0–38.1)
HE4: 75.5 pmol/L — ABNORMAL HIGH (ref 0.0–63.6)
Postmenopausal ROMA: 1.23
Premenopausal ROMA: 1.72 — ABNORMAL HIGH

## 2022-02-23 LAB — CYTOLOGY - PAP
Chlamydia: NEGATIVE
Comment: NEGATIVE
Comment: NEGATIVE
Comment: NORMAL
Diagnosis: NEGATIVE
High risk HPV: NEGATIVE
Neisseria Gonorrhea: NEGATIVE

## 2022-02-23 LAB — PREMENOPAUSAL INTERP: HIGH

## 2022-02-23 LAB — POSTMENOPAUSAL INTERP: LOW

## 2022-03-06 DIAGNOSIS — N83202 Unspecified ovarian cyst, left side: Secondary | ICD-10-CM | POA: Diagnosis not present

## 2022-03-06 DIAGNOSIS — R102 Pelvic and perineal pain: Secondary | ICD-10-CM | POA: Diagnosis not present

## 2022-03-09 ENCOUNTER — Other Ambulatory Visit: Payer: Self-pay | Admitting: Obstetrics and Gynecology

## 2022-03-09 DIAGNOSIS — N83202 Unspecified ovarian cyst, left side: Secondary | ICD-10-CM

## 2022-03-10 ENCOUNTER — Other Ambulatory Visit: Payer: Self-pay | Admitting: Obstetrics and Gynecology

## 2022-03-10 DIAGNOSIS — N83202 Unspecified ovarian cyst, left side: Secondary | ICD-10-CM

## 2022-03-20 ENCOUNTER — Telehealth: Payer: Self-pay

## 2022-03-20 DIAGNOSIS — D3910 Neoplasm of uncertain behavior of unspecified ovary: Secondary | ICD-10-CM | POA: Diagnosis not present

## 2022-03-20 DIAGNOSIS — G894 Chronic pain syndrome: Secondary | ICD-10-CM | POA: Diagnosis not present

## 2022-03-20 DIAGNOSIS — N950 Postmenopausal bleeding: Secondary | ICD-10-CM | POA: Diagnosis not present

## 2022-03-20 DIAGNOSIS — F909 Attention-deficit hyperactivity disorder, unspecified type: Secondary | ICD-10-CM | POA: Diagnosis not present

## 2022-03-20 NOTE — Telephone Encounter (Signed)
Patient called and stated that Dr Gaetano Net had sent over a referral and images on 10/16 or 10/17.  I explained we hadn't received anything as of yet.  Gave fax number for her to contact that office and make sure they had correct fax number.  Patient confirmed and understood.

## 2022-04-11 ENCOUNTER — Ambulatory Visit
Admission: RE | Admit: 2022-04-11 | Discharge: 2022-04-11 | Disposition: A | Discharge status: Home / Self Care | Payer: Medicare HMO | Source: Ambulatory Visit | Attending: Obstetrics and Gynecology | Admitting: Obstetrics and Gynecology

## 2022-04-11 DIAGNOSIS — K8689 Other specified diseases of pancreas: Secondary | ICD-10-CM | POA: Diagnosis not present

## 2022-04-11 DIAGNOSIS — N83202 Unspecified ovarian cyst, left side: Secondary | ICD-10-CM | POA: Diagnosis not present

## 2022-04-11 MED ORDER — IOPAMIDOL (ISOVUE-300) INJECTION 61%
100.0000 mL | Freq: Once | INTRAVENOUS | Status: AC | PRN
  Administered 2022-04-11: 100 mL via INTRAVENOUS

## 2022-04-20 ENCOUNTER — Encounter: Payer: Self-pay | Admitting: Psychiatry
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
## 2022-04-24 ENCOUNTER — Encounter: Payer: Self-pay | Admitting: Psychiatry

## 2022-04-24 ENCOUNTER — Other Ambulatory Visit: Payer: Self-pay

## 2022-04-24 ENCOUNTER — Other Ambulatory Visit: Payer: Self-pay | Admitting: Gynecologic Oncology

## 2022-04-24 ENCOUNTER — Inpatient Hospital Stay: Payer: Medicare HMO | Attending: Psychiatry | Admitting: Psychiatry

## 2022-04-24 ENCOUNTER — Inpatient Hospital Stay (HOSPITAL_BASED_OUTPATIENT_CLINIC_OR_DEPARTMENT_OTHER): Payer: Medicare HMO | Admitting: Gynecologic Oncology

## 2022-04-24 VITALS — BP 120/74 | HR 69 | Temp 98.0°F | Resp 16 | Ht 63.0 in | Wt 163.0 lb

## 2022-04-24 DIAGNOSIS — R978 Other abnormal tumor markers: Secondary | ICD-10-CM | POA: Diagnosis not present

## 2022-04-24 DIAGNOSIS — Z801 Family history of malignant neoplasm of trachea, bronchus and lung: Secondary | ICD-10-CM | POA: Diagnosis not present

## 2022-04-24 DIAGNOSIS — N838 Other noninflammatory disorders of ovary, fallopian tube and broad ligament: Secondary | ICD-10-CM

## 2022-04-24 DIAGNOSIS — K5900 Constipation, unspecified: Secondary | ICD-10-CM | POA: Insufficient documentation

## 2022-04-24 DIAGNOSIS — R140 Abdominal distension (gaseous): Secondary | ICD-10-CM | POA: Diagnosis not present

## 2022-04-24 DIAGNOSIS — N950 Postmenopausal bleeding: Secondary | ICD-10-CM | POA: Insufficient documentation

## 2022-04-24 DIAGNOSIS — F17210 Nicotine dependence, cigarettes, uncomplicated: Secondary | ICD-10-CM | POA: Diagnosis not present

## 2022-04-24 DIAGNOSIS — Z800 Family history of malignant neoplasm of digestive organs: Secondary | ICD-10-CM | POA: Insufficient documentation

## 2022-04-24 DIAGNOSIS — N879 Dysplasia of cervix uteri, unspecified: Secondary | ICD-10-CM | POA: Diagnosis not present

## 2022-04-24 DIAGNOSIS — Z803 Family history of malignant neoplasm of breast: Secondary | ICD-10-CM | POA: Diagnosis not present

## 2022-04-24 DIAGNOSIS — Z808 Family history of malignant neoplasm of other organs or systems: Secondary | ICD-10-CM | POA: Insufficient documentation

## 2022-04-24 DIAGNOSIS — D3912 Neoplasm of uncertain behavior of left ovary: Secondary | ICD-10-CM | POA: Diagnosis not present

## 2022-04-24 MED ORDER — SENNOSIDES-DOCUSATE SODIUM 8.6-50 MG PO TABS: 2.0000 | ORAL_TABLET | Freq: Every day | ORAL | 0 refills | Status: AC

## 2022-04-24 NOTE — Patient Instructions (Signed)
Preparing for your Surgery  Plan for surgery on May 02, 2022 with Dr. [name] at [place] will be scheduled for robotic assisted laparoscopic unilateral salpingo-oophorectomy (removal of one ovary and fallopian tube), possible staging including total hysterectomy/ removal of the other tube and ovary/lymph node dissection.   Pre-operative Testing -You will receive a phone call from presurgical testing at [facility] to arrange for a pre-operative appointment and lab work.  -Bring your insurance card, copy of an advanced directive if applicable, medication list  -At that visit, you will be asked to sign a consent for a possible blood transfusion in case a transfusion becomes necessary during surgery.  The need for a blood transfusion is rare but having consent is a necessary part of your care.     -You should not be taking blood thinners or aspirin at least ten days prior to surgery unless instructed by your surgeon.  -Do not take supplements such as fish oil (omega 3), red yeast rice, turmeric before your surgery. You want to avoid medications with aspirin in them including headache powders such as BC or Goody's), Excedrin migraine.  Day Before Surgery at [place] will be asked to take in a light diet the day before surgery. You will be advised you can have clear liquids up until 3 hours before your surgery.    Eat a light diet the day before surgery.  Examples including soups, broths, toast, yogurt, mashed potatoes.  AVOID GAS PRODUCING FOODS. Things to avoid include carbonated beverages (fizzy beverages, sodas), raw fruits and raw vegetables (uncooked), or beans.   If your bowels are filled with gas, your surgeon will have difficulty visualizing your pelvic organs which increases your surgical risks.  Your role in recovery Your role is to become active as soon as directed by your doctor, while still giving yourself time to heal.  Rest when you feel  tired. You will be asked to do the following in order to speed your recovery:  - Cough and breathe deeply. This helps to clear and expand your lungs and can prevent pneumonia after surgery.  - [place]. Do mild physical activity. Walking or moving your legs help your circulation and body functions return to normal. Do not try to get up or walk alone the first time after surgery.   -If you develop swelling on one leg or the other, pain in the back of your leg, redness/warmth in one of your legs, please call the office or go to the Emergency Room to have a doppler to rule out a blood clot. For shortness of breath, chest pain-seek care in the Emergency Room as soon as possible. - Actively manage your pain. Managing your pain lets you move in comfort. We will ask you to rate your pain on a scale of zero to 10. It is your responsibility to tell your doctor or nurse where and how much you hurt so your pain can be treated.  Special Considerations -If you are diabetic, you may be placed on insulin after surgery to have closer control over your blood sugars to promote healing and recovery.  This does not mean that you will be discharged on insulin.  If applicable, your oral antidiabetics will be resumed when you are tolerating a solid diet.  -Your final pathology results from surgery should be available around one week after surgery and the results will be relayed to you when available.  -Dr. Lahoma Crocker is  the surgeon that assists your GYN Oncologist with surgery.  If you end up staying the night, the next day after your surgery you will either see Dr. Berline Lopes, Dr. Ernestina Patches, or Dr. Lahoma Crocker.  -FMLA forms can be faxed to [phone] and please allow 5-7 business days for completion.  Pain Management After Surgery You can continue with your home pain medication regimen. We will reach out to your prescribing provider about pain medication for after surgery. You can use  tylenol but monitor the amount of tylenol you are using since hydrocodone/APAP has tylenol in it as well.  Bowel Regimen -You have been prescribed Sennakot-S to take nightly to prevent constipation especially if you are taking the narcotic pain medication intermittently.  It is important to prevent constipation and drink adequate amounts of liquids. You can stop taking this medication when you are not taking pain medication and you are back on your normal bowel routine.  Risks of Surgery Risks of surgery are low but include bleeding, infection, damage to surrounding structures, re-operation, blood clots, and very rarely death.   Blood Transfusion Information (For the consent to be signed before surgery)  We will be checking your blood type before surgery so in case of emergencies, we will know what type of blood you would need.                                            WHAT IS A BLOOD TRANSFUSION?  A transfusion is the replacement of blood or some of its parts. Blood is made up of multiple cells which provide different functions. Red blood cells carry oxygen and are used for blood loss replacement. White blood cells fight against infection. Platelets control bleeding. Plasma helps clot blood. Other blood products are available for specialized needs, such as hemophilia or other clotting disorders. BEFORE THE TRANSFUSION  Who gives blood for transfusions?  You may be able to donate blood to be used at a later date on yourself (autologous donation). Relatives can be asked to donate blood. This is generally not any safer than if you have received blood from a stranger. The same precautions are taken to ensure safety when a relative's blood is donated. Healthy volunteers who are fully evaluated to make sure their blood is safe. This is blood bank blood. Transfusion therapy is the safest it has ever been in the practice of medicine. Before blood is taken from a donor, a complete history is taken  to make sure that person has no history of diseases nor engages in risky social behavior (examples are intravenous drug use or sexual activity with multiple partners). The donor's travel history is screened to minimize risk of transmitting infections, such as malaria. The donated blood is tested for signs of infectious diseases, such as HIV and hepatitis. The blood is then tested to be sure it is compatible with you in order to minimize the chance of a transfusion reaction. If you or a relative donates blood, this is often done in anticipation of surgery and is not appropriate for emergency situations. It takes many days to process the donated blood. RISKS AND COMPLICATIONS Although transfusion therapy is very safe and saves many lives, the main dangers of transfusion include:  Getting an infectious disease. Developing a transfusion reaction. This is an allergic reaction to something in the blood you were given. Every precaution is taken to  prevent this. The decision to have a blood transfusion has been considered carefully by your caregiver before blood is given. Blood is not given unless the benefits outweigh the risks.  AFTER SURGERY INSTRUCTIONS  Return to work: 4-6 weeks if applicable  Activity: 1. Be up and out of the bed during the day.  Take a nap if needed.  You may walk up steps but be careful and use the hand rail.  Stair climbing will tire you more than you think, you may need to stop part way and rest.   2. No lifting or straining for 6 weeks over 10 pounds. No pushing, pulling, straining for 6 weeks.  3. No driving for around 1 week(s).  Do not drive if you are taking narcotic pain medicine and make sure that your reaction time has returned.   4. You can shower as soon as the next day after surgery. Shower daily.  Use your regular soap and water (not directly on the incision) and pat your incision(s) dry afterwards; don't rub.  No tub baths or submerging your body in water until  cleared by your surgeon. If you have the soap that was given to you by pre-surgical testing that was used before surgery, you do not need to use it afterwards because this can irritate your incisions.   5. No sexual activity and nothing in the vagina for 4 weeks, 10 weeks if you have a hysterectomy (removal of the uterus and cervix).  6. You may experience a small amount of clear drainage from your incisions, which is normal.  If the drainage persists, increases, or changes color please call the office.  7. Do not use creams, lotions, or ointments such as neosporin on your incisions after surgery until advised by your surgeon because they can cause removal of the dermabond glue on your incisions.    8. You may experience vaginal spotting after surgery or around the 6-8 week mark from surgery when the stitches at the top of the vagina begin to dissolve (if you have a hysterectomy).  The spotting is normal but if you experience heavy bleeding, call our office.  9. You can continue on your home pain medication regimen. Monitor your Tylenol intake to a max of 4,000 mg in a 24 hour period.   Diet: 1. Low sodium Heart Healthy Diet is recommended but you are cleared to resume your normal (before surgery) diet after your procedure.  2. It is safe to use a laxative, such as Miralax or Colace, if you have difficulty moving your bowels. You have been prescribed Sennakot-S to take at bedtime every evening after surgery to keep bowel movements regular and to prevent constipation.    Wound Care: 1. Keep clean and dry.  Shower daily.  Reasons to call the Doctor: Fever - Oral temperature greater than 100.4 degrees Fahrenheit Foul-smelling vaginal discharge Difficulty urinating Nausea and vomiting Increased pain at the site of the incision that is unrelieved with pain medicine. Difficulty breathing with or without chest pain New calf pain especially if only on one side Sudden, continuing increased vaginal  bleeding with or without clots.   Contacts: For questions or concerns you should contact:  Dr. [name] at [place], NP at [phone]  After Hours: call [phone] and have the GYN Oncologist paged/contacted (after 5 pm or on the weekends).  Messages sent via mychart are for non-urgent matters and are not responded to after hours so for urgent needs, please call the  after hours number.

## 2022-04-25 ENCOUNTER — Telehealth: Payer: Self-pay

## 2022-04-25 LAB — SURGICAL PATHOLOGY

## 2022-04-25 NOTE — Telephone Encounter (Signed)
Pt returned call and aware of biopsy results

## 2022-04-25 NOTE — Telephone Encounter (Signed)
LVM for patient to call office regarding biopsy results from12/4/23,   Per Joylene John NP,  lendometrial biopsy from yesterday 12/4 is benign. No precancer or cancer seen.

## 2022-04-26 NOTE — Patient Instructions (Signed)
SURGICAL WAITING ROOM VISITATION Patients having surgery or a procedure may have no more than 2 support people in the waiting area - these visitors may rotate in the visitor waiting room.   Children under the age of 4 must have an adult with them who is not the patient. If the patient needs to stay at the hospital during part of their recovery, the visitor guidelines for inpatient rooms apply.  PRE-OP VISITATION  Pre-op nurse will coordinate an appropriate time for 1 support person to accompany the patient in pre-op.  This support person may not rotate.  This visitor will be contacted when the time is appropriate for the visitor to come back in the pre-op area.  Please refer to the [facility] website for the visitor guidelines for Inpatients (after your surgery is over and you are in a regular room).  You are not required to quarantine at this time prior to your surgery. However, you must do this: Hand Hygiene often Do NOT share personal items Notify your provider if you are in close contact with someone who has COVID or you develop fever 100.4 or greater, new onset of sneezing, cough, sore throat, shortness of breath or body aches.  If you test positive for Covid or have been in contact with anyone that has tested positive in the last 10 days please notify you surgeon.    Your procedure is scheduled on:  Tuesday    May 02, 2022   Report to [facility] Main Entrance: [place] entrance where the [employer] is available.   Report to admitting at: 10:15    AM  +++++Call this number if you have any questions or problems the morning of surgery [phone]  Do not eat food after Midnight the night prior to your surgery/procedure.  After Midnight you may have the following liquids until   09:30 AM DAY OF SURGERY  Clear Liquid Diet Water Black Coffee (sugar ok, NO MILK/CREAM OR CREAMERS)  Tea (sugar ok, NO MILK/CREAM OR CREAMERS) regular and decaf                              Plain Jell-O  with no fruit (NO RED)                                           Fruit ices (not with fruit pulp, NO RED)                                     Popsicles (NO RED)                                                                  Juice: apple, WHITE grape, WHITE cranberry Sports drinks like Gatorade or Powerade (NO RED)               FOLLOW BOWEL PREP AND ANY ADDITIONAL PRE OP INSTRUCTIONS YOU RECEIVED FROM YOUR SURGEON'S OFFICE!!!  Eat a light diet the day before surgery.  Examples including soups, broths, toast, yogurt, mashed  potatoes.  AVOID GAS PRODUCING FOODS. Things to avoid include carbonated beverages (fizzy beverages, sodas), raw fruits and raw vegetables (uncooked), or beans.       Oral Hygiene is also important to reduce your risk of infection.        Remember - BRUSH YOUR TEETH THE MORNING OF SURGERY WITH YOUR REGULAR TOOTHPASTE  Do NOT smoke after Midnight the night before surgery.  Take ONLY these medicines the morning of surgery with A SIP OF WATER:   none                     You may not have any metal on your body including hair pins, jewelry, and body piercing  Do not wear make-up, lotions, powders, perfumes or deodorant  Do not wear nail polish including gel and S&S, artificial / acrylic nails, or any other type of covering on natural nails including finger and toenails. If you have artificial nails, gel coating, etc., that needs to be removed by a nail salon, Please have this removed prior to surgery. Not doing so may mean that your surgery could be cancelled or delayed if the Surgeon or anesthesia staff feels like they are unable to monitor you safely.   Do not shave 48 hours prior to surgery to avoid nicks in your skin which may contribute to postoperative infections.   Patients discharged on the day of surgery will not be allowed to drive home.  Someone NEEDS to stay with you for the first 24 hours after anesthesia.  Do not bring your home  medications to the hospital. The Pharmacy will dispense medications listed on your medication list to you during your admission in the Hospital.  Special Instructions: Bring a copy of your healthcare power of attorney and living will documents the day of surgery, if you wish to have them scanned into your  Medical Records- EPIC  Please read over the following fact sheets you were given: IF YOU HAVE QUESTIONS ABOUT YOUR PRE-OP INSTRUCTIONS, PLEASE CALL [phone]  ([place])   [place] - Preparing for Surgery Before surgery, you can play an important role.  Because skin is not sterile, your skin needs to be as free of germs as possible.  You can reduce the number of germs on your skin by washing with CHG (chlorahexidine gluconate) soap before surgery.  CHG is an antiseptic cleaner which kills germs and bonds with the skin to continue killing germs even after washing. Please DO NOT use if you have an allergy to CHG or antibacterial soaps.  If your skin becomes reddened/irritated stop using the CHG and inform your nurse when you arrive at Short Stay. Do not shave (including legs and underarms) for at least 48 hours prior to the first CHG shower.  You may shave your face/neck.  Please follow these instructions carefully:  1.  Shower with CHG Soap the night before surgery and the  morning of surgery.  2.  If you choose to wash your hair, wash your hair first as usual with your normal  shampoo.  3.  After you shampoo, rinse your hair and body thoroughly to remove the shampoo.                             4.  Use CHG as you would any other liquid soap.  You can apply chg directly to the skin and wash.  Gently with a scrungie or clean washcloth.  5.  Apply the CHG Soap to your body ONLY FROM THE NECK DOWN.   Do not use on face/ open                           Wound or open sores. Avoid contact with eyes, ears mouth and genitals (private parts).                       Wash face,  Genitals (private  parts) with your normal soap.             6.  Wash thoroughly, paying special attention to the area where your  surgery  will be performed.  7.  Thoroughly rinse your body with warm water from the neck down.  8.  DO NOT shower/wash with your normal soap after using and rinsing off the CHG Soap.            9.  Pat yourself dry with a clean towel.            10.  Wear clean pajamas.            11.  Place clean sheets on your bed the night of your first shower and do not  sleep with pets.  ON THE DAY OF SURGERY : Do not apply any lotions/deodorants the morning of surgery.  Please wear clean clothes to the hospital/surgery center.    FAILURE TO FOLLOW THESE INSTRUCTIONS MAY RESULT IN THE CANCELLATION OF YOUR SURGERY  PATIENT SIGNATURE_________________________________  NURSE SIGNATURE__________________________________  ________________________________________________________________________

## 2022-04-26 NOTE — Progress Notes (Signed)
The patient was identified using 2 approved identifiers. All issues noted in this document were discussed and addressed, Beth Jefferson    voiced understanding and agreement with all preoperative instructions.     Patient will come in later today to have labs drawn and to sign consents. She will be given  written preop instructions and her CHG soap.   COVID Vaccine received:  _0  No _1  Yes Date of any COVID positive Test in last 90 days: none  PCP - [employer], MD at [facility]  Cardiologist - none  Chest x-ray -  EKG -  no Pertinent hx Stress Test -  ECHO -  Cardiac Cath -   PCR screen: _2  Ordered & Completed                      _3   No Order but Needs PROFEND                      _4   N/A for this surgery  Surgery Plan:  _5  Ambulatory                            _6  Outpatient in bed                            _7  Admit  Anesthesia:    _8  General  _9  Spinal                           _10   Choice _11   MAC  Bowel Prep - _12  No  _13   Yes _____________  Pacemaker / ICD device _14  No _15  Yes        Device order form faxed _16  No    _17   Yes      Faxed to:  Spinal Cord Stimulator:_18  No _19  Yes      (Remind patient to bring remote DOS) Other Implants:   History of Sleep Apnea? _20  No _21  Yes   CPAP used?- _22  No _23  Yes    Does the patient monitor blood sugar? _24  No _25  Yes  _26  N/A  Blood Thinner / Instructions:none Aspirin Instructions:none  ERAS Protocol Ordered: _27  No  _28  Yes PRE-SURGERY _29  ENSURE  _30  G2  _31  No Drink Ordered  Patient is to be NPO after: 09:30 am  Activity level: Patient can not climb a flight of stairs without difficulty; _32  No CP  _33  No SOB, but would have _leg pain  Patient can  perform ADLs without assistance.   Anesthesia review: smoker,  no pertinent hx  Patient denies shortness of breath, fever, cough and chest pain at PAT appointment.  Patient verbalized understanding and agreement to the Pre-Surgical Instructions that were given to them  at this PAT appointment. Patient was also educated of the need to review these PAT instructions again prior to his/her surgery.I reviewed the appropriate phone numbers to call if they have any and questions or concerns.

## 2022-04-27 ENCOUNTER — Encounter (HOSPITAL_COMMUNITY): Payer: Self-pay

## 2022-04-27 ENCOUNTER — Encounter (HOSPITAL_COMMUNITY)
Admission: RE | Admit: 2022-04-27 | Discharge: 2022-04-27 | Disposition: A | Discharge status: Home / Self Care | Payer: Medicare HMO | Source: Ambulatory Visit | Attending: Psychiatry | Admitting: Psychiatry

## 2022-04-27 ENCOUNTER — Other Ambulatory Visit: Payer: Self-pay

## 2022-04-27 DIAGNOSIS — R978 Other abnormal tumor markers: Secondary | ICD-10-CM | POA: Diagnosis not present

## 2022-04-27 DIAGNOSIS — Z01812 Encounter for preprocedural laboratory examination: Secondary | ICD-10-CM | POA: Insufficient documentation

## 2022-04-27 DIAGNOSIS — N838 Other noninflammatory disorders of ovary, fallopian tube and broad ligament: Secondary | ICD-10-CM | POA: Insufficient documentation

## 2022-04-27 HISTORY — DX: Anxiety disorder, unspecified: F41.9

## 2022-04-27 HISTORY — DX: Attention-deficit hyperactivity disorder, unspecified type: F90.9

## 2022-04-27 LAB — COMPREHENSIVE METABOLIC PANEL
ALT: 8 U/L (ref 0–44)
AST: 13 U/L — ABNORMAL LOW (ref 15–41)
Albumin: 3.8 g/dL (ref 3.5–5.0)
Alkaline Phosphatase: 60 U/L (ref 38–126)
Anion gap: 6 (ref 5–15)
BUN: 8 mg/dL (ref 6–20)
CO2: 29 mmol/L (ref 22–32)
Calcium: 8.9 mg/dL (ref 8.9–10.3)
Chloride: 105 mmol/L (ref 98–111)
Creatinine, Ser: 0.56 mg/dL (ref 0.44–1.00)
GFR, Estimated: >60 mL/min (ref >60)
Glucose, Bld: 108 mg/dL — ABNORMAL HIGH (ref 70–99)
Potassium: 4 mmol/L (ref 3.5–5.1)
Sodium: 140 mmol/L (ref 135–145)
Total Bilirubin: 0.5 mg/dL (ref 0.3–1.2)
Total Protein: 6.9 g/dL (ref 6.5–8.1)

## 2022-04-27 LAB — CBC
HCT: 40.8 % (ref 36.0–46.0)
Hemoglobin: 12.7 g/dL (ref 12.0–15.0)
MCH: 30.3 pg (ref 26.0–34.0)
MCHC: 31.1 g/dL (ref 30.0–36.0)
MCV: 97.4 fL (ref 80.0–100.0)
Platelets: 235 10*3/uL (ref 150–400)
RBC: 4.19 MIL/uL (ref 3.87–5.11)
RDW: 13.5 % (ref 11.5–15.5)
WBC: 6.4 10*3/uL (ref 4.0–10.5)
nRBC: 0 % (ref 0.0–0.2)

## 2022-04-28 NOTE — Patient Instructions (Signed)
Preparing for your Surgery   Plan for surgery on May 02, 2022 with Dr. [name] at [place] will be scheduled for robotic assisted laparoscopic unilateral salpingo-oophorectomy (removal of one ovary and fallopian tube), possible staging including total hysterectomy/ removal of the other tube and ovary/lymph node dissection.    Pre-operative Testing -You will receive a phone call from presurgical testing at [facility] to arrange for a pre-operative appointment and lab work.   -Bring your insurance card, copy of an advanced directive if applicable, medication list   -At that visit, you will be asked to sign a consent for a possible blood transfusion in case a transfusion becomes necessary during surgery.  The need for a blood transfusion is rare but having consent is a necessary part of your care.      -You should not be taking blood thinners or aspirin at least ten days prior to surgery unless instructed by your surgeon.   -Do not take supplements such as fish oil (omega 3), red yeast rice, turmeric before your surgery. You want to avoid medications with aspirin in them including headache powders such as BC or Goody's), Excedrin migraine.   Day Before Surgery at [place] will be asked to take in a light diet the day before surgery. You will be advised you can have clear liquids up until 3 hours before your surgery.     Eat a light diet the day before surgery.  Examples including soups, broths, toast, yogurt, mashed potatoes.  AVOID GAS PRODUCING FOODS. Things to avoid include carbonated beverages (fizzy beverages, sodas), raw fruits and raw vegetables (uncooked), or beans.    If your bowels are filled with gas, your surgeon will have difficulty visualizing your pelvic organs which increases your surgical risks.   Your role in recovery Your role is to become active as soon as directed by your doctor, while still giving yourself time to heal.  Rest when you  feel tired. You will be asked to do the following in order to speed your recovery:   - Cough and breathe deeply. This helps to clear and expand your lungs and can prevent pneumonia after surgery.  - [place]. Do mild physical activity. Walking or moving your legs help your circulation and body functions return to normal. Do not try to get up or walk alone the first time after surgery.   -If you develop swelling on one leg or the other, pain in the back of your leg, redness/warmth in one of your legs, please call the office or go to the Emergency Room to have a doppler to rule out a blood clot. For shortness of breath, chest pain-seek care in the Emergency Room as soon as possible. - Actively manage your pain. Managing your pain lets you move in comfort. We will ask you to rate your pain on a scale of zero to 10. It is your responsibility to tell your doctor or nurse where and how much you hurt so your pain can be treated.   Special Considerations -If you are diabetic, you may be placed on insulin after surgery to have closer control over your blood sugars to promote healing and recovery.  This does not mean that you will be discharged on insulin.  If applicable, your oral antidiabetics will be resumed when you are tolerating a solid diet.   -Your final pathology results from surgery should be available around one week after surgery and the  results will be relayed to you when available.   -Dr. Lahoma Crocker is the surgeon that assists your GYN Oncologist with surgery.  If you end up staying the night, the next day after your surgery you will either see Dr. Berline Lopes, Dr. Ernestina Patches, or Dr. Lahoma Crocker.   -FMLA forms can be faxed to [phone] and please allow 5-7 business days for completion.   Pain Management After Surgery You can continue with your home pain medication regimen. We will reach out to your prescribing provider about pain medication for after surgery. You  can use tylenol but monitor the amount of tylenol you are using since hydrocodone/APAP has tylenol in it as well.   Bowel Regimen -You have been prescribed Sennakot-S to take nightly to prevent constipation especially if you are taking the narcotic pain medication intermittently.  It is important to prevent constipation and drink adequate amounts of liquids. You can stop taking this medication when you are not taking pain medication and you are back on your normal bowel routine.   Risks of Surgery Risks of surgery are low but include bleeding, infection, damage to surrounding structures, re-operation, blood clots, and very rarely death.     Blood Transfusion Information (For the consent to be signed before surgery)   We will be checking your blood type before surgery so in case of emergencies, we will know what type of blood you would need.                                             WHAT IS A BLOOD TRANSFUSION?   A transfusion is the replacement of blood or some of its parts. Blood is made up of multiple cells which provide different functions. Red blood cells carry oxygen and are used for blood loss replacement. White blood cells fight against infection. Platelets control bleeding. Plasma helps clot blood. Other blood products are available for specialized needs, such as hemophilia or other clotting disorders. BEFORE THE TRANSFUSION  Who gives blood for transfusions?  You may be able to donate blood to be used at a later date on yourself (autologous donation). Relatives can be asked to donate blood. This is generally not any safer than if you have received blood from a stranger. The same precautions are taken to ensure safety when a relative's blood is donated. Healthy volunteers who are fully evaluated to make sure their blood is safe. This is blood bank blood. Transfusion therapy is the safest it has ever been in the practice of medicine. Before blood is taken from a donor, a complete  history is taken to make sure that person has no history of diseases nor engages in risky social behavior (examples are intravenous drug use or sexual activity with multiple partners). The donor's travel history is screened to minimize risk of transmitting infections, such as malaria. The donated blood is tested for signs of infectious diseases, such as HIV and hepatitis. The blood is then tested to be sure it is compatible with you in order to minimize the chance of a transfusion reaction. If you or a relative donates blood, this is often done in anticipation of surgery and is not appropriate for emergency situations. It takes many days to process the donated blood. RISKS AND COMPLICATIONS Although transfusion therapy is very safe and saves many lives, the main dangers of transfusion include:  Getting an infectious  disease. Developing a transfusion reaction. This is an allergic reaction to something in the blood you were given. Every precaution is taken to prevent this. The decision to have a blood transfusion has been considered carefully by your caregiver before blood is given. Blood is not given unless the benefits outweigh the risks.   AFTER SURGERY INSTRUCTIONS   Return to work: 4-6 weeks if applicable   Activity: 1. Be up and out of the bed during the day.  Take a nap if needed.  You may walk up steps but be careful and use the hand rail.  Stair climbing will tire you more than you think, you may need to stop part way and rest.    2. No lifting or straining for 6 weeks over 10 pounds. No pushing, pulling, straining for 6 weeks.   3. No driving for around 1 week(s).  Do not drive if you are taking narcotic pain medicine and make sure that your reaction time has returned.    4. You can shower as soon as the next day after surgery. Shower daily.  Use your regular soap and water (not directly on the incision) and pat your incision(s) dry afterwards; don't rub.  No tub baths or submerging your  body in water until cleared by your surgeon. If you have the soap that was given to you by pre-surgical testing that was used before surgery, you do not need to use it afterwards because this can irritate your incisions.    5. No sexual activity and nothing in the vagina for 4 weeks, 10 weeks if you have a hysterectomy (removal of the uterus and cervix).   6. You may experience a small amount of clear drainage from your incisions, which is normal.  If the drainage persists, increases, or changes color please call the office.   7. Do not use creams, lotions, or ointments such as neosporin on your incisions after surgery until advised by your surgeon because they can cause removal of the dermabond glue on your incisions.     8. You may experience vaginal spotting after surgery or around the 6-8 week mark from surgery when the stitches at the top of the vagina begin to dissolve (if you have a hysterectomy).  The spotting is normal but if you experience heavy bleeding, call our office.   9. You can continue on your home pain medication regimen. Monitor your Tylenol intake to a max of 4,000 mg in a 24 hour period.    Diet: 1. Low sodium Heart Healthy Diet is recommended but you are cleared to resume your normal (before surgery) diet after your procedure.   2. It is safe to use a laxative, such as Miralax or Colace, if you have difficulty moving your bowels. You have been prescribed Sennakot-S to take at bedtime every evening after surgery to keep bowel movements regular and to prevent constipation.     Wound Care: 1. Keep clean and dry.  Shower daily.   Reasons to call the Doctor: Fever - Oral temperature greater than 100.4 degrees Fahrenheit Foul-smelling vaginal discharge Difficulty urinating Nausea and vomiting Increased pain at the site of the incision that is unrelieved with pain medicine. Difficulty breathing with or without chest pain New calf pain especially if only on one side Sudden,  continuing increased vaginal bleeding with or without clots.   Contacts: For questions or concerns you should contact:   Dr. [name] at [phone]   Joylene John, NP at [phone]  After Hours: call [phone] and have the GYN Oncologist paged/contacted (after 5 pm or on the weekends).   Messages sent via mychart are for non-urgent matters and are not responded to after hours so for urgent needs, please call the after hours number.

## 2022-04-28 NOTE — Progress Notes (Signed)
Patient here for new patient consultation with Dr. Ernestina Patches and for a pre-operative appointment prior to her scheduled surgery on May 02, 2022. She is scheduled for a robotic assisted laparoscopic unilateral salpingo-oophorectomy, possible staging including total hysterectomy/ removal of the other tube and ovary/lymph node dissection. The surgery was discussed in detail.  See after visit summary for additional details. Visual aids used to discuss items related to surgery.   Discussed post-op pain management in detail including the aspects of the enhanced recovery pathway.  She is on hydrocodone chronically and our office will reach out to her prescribing provider for pain management after surgery. We discussed the use of tylenol post-op and to monitor for a maximum of 4,000 mg in a 24 hour period.  Also prescribed sennakot to be used after surgery and to hold if having loose stools.  Discussed bowel regimen in detail.     Discussed measures to take at home to prevent DVT including frequent mobility.  Reportable signs and symptoms of DVT discussed. Post-operative instructions discussed and expectations for after surgery. Incisional care discussed as well including reportable signs and symptoms including erythema, drainage, wound separation.     5 minutes spent with the patient.  Verbalizing understanding of material discussed. No needs or concerns voiced at the end of the visit.   Advised patient to call for any needs.  This appointment is included in the global surgical bundle as pre-operative teaching and has no charge.

## 2022-05-01 ENCOUNTER — Telehealth: Payer: Self-pay | Admitting: Surgery

## 2022-05-01 NOTE — Telephone Encounter (Signed)
Telephone call to check on pre-operative status.  Patient compliant with pre-operative instructions.  Reinforced nothing to eat after midnight. Clear liquids until 9:15am. Patient to arrive at 10:15am. Verified that post-op medications have been sent to patient's preferred pharmacy.  No questions or concerns voiced.  Instructed to call for any needs.

## 2022-05-02 ENCOUNTER — Ambulatory Visit (HOSPITAL_COMMUNITY)
Admission: RE | Admit: 2022-05-02 | Discharge: 2022-05-02 | Disposition: A | Discharge status: Home / Self Care | Payer: Medicare HMO | Source: Ambulatory Visit | Attending: Psychiatry | Admitting: Psychiatry

## 2022-05-02 ENCOUNTER — Ambulatory Visit (HOSPITAL_BASED_OUTPATIENT_CLINIC_OR_DEPARTMENT_OTHER): Payer: Medicare HMO | Admitting: Anesthesiology

## 2022-05-02 ENCOUNTER — Encounter (HOSPITAL_COMMUNITY): Payer: Self-pay | Admitting: Psychiatry

## 2022-05-02 ENCOUNTER — Other Ambulatory Visit: Payer: Self-pay

## 2022-05-02 ENCOUNTER — Encounter (HOSPITAL_COMMUNITY)
Admission: RE | Disposition: A | Discharge status: Home / Self Care | Payer: Self-pay | Source: Ambulatory Visit | Attending: Psychiatry

## 2022-05-02 ENCOUNTER — Ambulatory Visit (HOSPITAL_COMMUNITY): Payer: Medicare HMO | Admitting: Anesthesiology

## 2022-05-02 ENCOUNTER — Telehealth: Payer: Self-pay | Admitting: *Deleted

## 2022-05-02 DIAGNOSIS — N838 Other noninflammatory disorders of ovary, fallopian tube and broad ligament: Secondary | ICD-10-CM

## 2022-05-02 DIAGNOSIS — F17210 Nicotine dependence, cigarettes, uncomplicated: Secondary | ICD-10-CM | POA: Diagnosis not present

## 2022-05-02 DIAGNOSIS — Z808 Family history of malignant neoplasm of other organs or systems: Secondary | ICD-10-CM | POA: Diagnosis not present

## 2022-05-02 DIAGNOSIS — N839 Noninflammatory disorder of ovary, fallopian tube and broad ligament, unspecified: Secondary | ICD-10-CM | POA: Diagnosis not present

## 2022-05-02 DIAGNOSIS — F17200 Nicotine dependence, unspecified, uncomplicated: Secondary | ICD-10-CM | POA: Diagnosis not present

## 2022-05-02 DIAGNOSIS — Z803 Family history of malignant neoplasm of breast: Secondary | ICD-10-CM | POA: Diagnosis not present

## 2022-05-02 DIAGNOSIS — N950 Postmenopausal bleeding: Secondary | ICD-10-CM | POA: Diagnosis not present

## 2022-05-02 DIAGNOSIS — Z800 Family history of malignant neoplasm of digestive organs: Secondary | ICD-10-CM | POA: Insufficient documentation

## 2022-05-02 DIAGNOSIS — D271 Benign neoplasm of left ovary: Secondary | ICD-10-CM | POA: Insufficient documentation

## 2022-05-02 DIAGNOSIS — F419 Anxiety disorder, unspecified: Secondary | ICD-10-CM

## 2022-05-02 DIAGNOSIS — Z801 Family history of malignant neoplasm of trachea, bronchus and lung: Secondary | ICD-10-CM | POA: Insufficient documentation

## 2022-05-02 DIAGNOSIS — R978 Other abnormal tumor markers: Secondary | ICD-10-CM

## 2022-05-02 DIAGNOSIS — Z01818 Encounter for other preprocedural examination: Secondary | ICD-10-CM

## 2022-05-02 HISTORY — PX: ROBOTIC ASSISTED SALPINGO OOPHERECTOMY: SHX6082

## 2022-05-02 LAB — TYPE AND SCREEN
ABO/RH(D): O POS
Antibody Screen: NEGATIVE

## 2022-05-02 LAB — POCT PREGNANCY, URINE: Preg Test, Ur: NEGATIVE

## 2022-05-02 LAB — ABO/RH: ABO/RH(D): O POS

## 2022-05-02 SURGERY — SALPINGO-OOPHORECTOMY, ROBOT-ASSISTED
Anesthesia: General

## 2022-05-02 MED ORDER — OXYCODONE HCL 5 MG PO TABS
5.0000 mg | ORAL_TABLET | Freq: Once | ORAL | Status: AC | PRN
  Administered 2022-05-02: 5 mg via ORAL

## 2022-05-02 MED ORDER — HYDRALAZINE HCL 20 MG/ML IJ SOLN
INTRAMUSCULAR | Status: AC
  Filled 2022-05-02: qty 1

## 2022-05-02 MED ORDER — HYDROMORPHONE HCL 1 MG/ML IJ SOLN
INTRAMUSCULAR | Status: AC
  Administered 2022-05-02: 0.5 mg via INTRAVENOUS
  Filled 2022-05-02: qty 1

## 2022-05-02 MED ORDER — BUPIVACAINE HCL 0.25 % IJ SOLN
INTRAMUSCULAR | Status: AC
  Filled 2022-05-02: qty 1

## 2022-05-02 MED ORDER — BUPIVACAINE HCL 0.25 % IJ SOLN
INTRAMUSCULAR | Status: DC | PRN
  Administered 2022-05-02: 25 mL

## 2022-05-02 MED ORDER — SUGAMMADEX SODIUM 200 MG/2ML IV SOLN: INTRAVENOUS | Status: DC | PRN

## 2022-05-02 MED ORDER — STERILE WATER FOR IRRIGATION IR SOLN
Status: DC | PRN
  Administered 2022-05-02: 1000 mL

## 2022-05-02 MED ORDER — GABAPENTIN 300 MG PO CAPS
300.0000 mg | ORAL_CAPSULE | ORAL | Status: AC
  Administered 2022-05-02: 300 mg via ORAL
  Filled 2022-05-02: qty 1

## 2022-05-02 MED ORDER — OXYCODONE HCL 5 MG PO TABS
ORAL_TABLET | ORAL | Status: AC
  Filled 2022-05-02: qty 1

## 2022-05-02 MED ORDER — ONDANSETRON HCL 4 MG/2ML IJ SOLN
INTRAMUSCULAR | Status: AC
  Filled 2022-05-02: qty 2

## 2022-05-02 MED ORDER — SUGAMMADEX SODIUM 200 MG/2ML IV SOLN
INTRAVENOUS | Status: DC | PRN
  Administered 2022-05-02: 200 mg via INTRAVENOUS

## 2022-05-02 MED ORDER — ACETAMINOPHEN 500 MG PO TABS
1000.0000 mg | ORAL_TABLET | Freq: Once | ORAL | Status: AC
  Administered 2022-05-02: 1000 mg via ORAL

## 2022-05-02 MED ORDER — MIDAZOLAM HCL 2 MG/2ML IJ SOLN
INTRAMUSCULAR | Status: AC
  Filled 2022-05-02: qty 2

## 2022-05-02 MED ORDER — GLYCOPYRROLATE 0.2 MG/ML IJ SOLN
INTRAMUSCULAR | Status: DC | PRN
  Administered 2022-05-02: .1 mg via INTRAVENOUS

## 2022-05-02 MED ORDER — PROPOFOL 10 MG/ML IV BOLUS
INTRAVENOUS | Status: AC
  Filled 2022-05-02: qty 20

## 2022-05-02 MED ORDER — FENTANYL CITRATE (PF) 100 MCG/2ML IJ SOLN
INTRAMUSCULAR | Status: AC
  Filled 2022-05-02: qty 2

## 2022-05-02 MED ORDER — ROCURONIUM BROMIDE 10 MG/ML (PF) SYRINGE
PREFILLED_SYRINGE | INTRAVENOUS | Status: AC
  Filled 2022-05-02: qty 10

## 2022-05-02 MED ORDER — MIDAZOLAM HCL 5 MG/5ML IJ SOLN
INTRAMUSCULAR | Status: DC | PRN
  Administered 2022-05-02 (×2): 1 mg via INTRAVENOUS

## 2022-05-02 MED ORDER — FENTANYL CITRATE (PF) 250 MCG/5ML IJ SOLN
INTRAMUSCULAR | Status: AC
  Filled 2022-05-02: qty 5

## 2022-05-02 MED ORDER — ONDANSETRON HCL 4 MG/2ML IJ SOLN
INTRAMUSCULAR | Status: DC | PRN
  Administered 2022-05-02: 4 mg via INTRAVENOUS

## 2022-05-02 MED ORDER — STERILE WATER FOR INJECTION IJ SOLN
INTRAMUSCULAR | Status: AC
  Filled 2022-05-02: qty 10

## 2022-05-02 MED ORDER — ONDANSETRON HCL 4 MG/2ML IJ SOLN
4.0000 mg | Freq: Once | INTRAMUSCULAR | Status: AC | PRN
  Administered 2022-05-02: 4 mg via INTRAVENOUS

## 2022-05-02 MED ORDER — LIDOCAINE HCL (PF) 2 % IJ SOLN
INTRAMUSCULAR | Status: DC | PRN
  Administered 2022-05-02: 1.5 mg/kg/h via INTRADERMAL

## 2022-05-02 MED ORDER — ACETAMINOPHEN 500 MG PO TABS
1000.0000 mg | ORAL_TABLET | ORAL | Status: DC
  Filled 2022-05-02: qty 2

## 2022-05-02 MED ORDER — LIDOCAINE HCL (CARDIAC) PF 100 MG/5ML IV SOSY
PREFILLED_SYRINGE | INTRAVENOUS | Status: DC | PRN
  Administered 2022-05-02: 60 mg via INTRAVENOUS

## 2022-05-02 MED ORDER — DEXAMETHASONE SODIUM PHOSPHATE 10 MG/ML IJ SOLN
INTRAMUSCULAR | Status: AC
  Filled 2022-05-02: qty 1

## 2022-05-02 MED ORDER — DEXAMETHASONE SODIUM PHOSPHATE 4 MG/ML IJ SOLN
4.0000 mg | INTRAMUSCULAR | Status: AC
  Administered 2022-05-02: 4 mg via INTRAVENOUS

## 2022-05-02 MED ORDER — SCOPOLAMINE 1 MG/3DAYS TD PT72
1.0000 | MEDICATED_PATCH | TRANSDERMAL | Status: DC
  Filled 2022-05-02: qty 1

## 2022-05-02 MED ORDER — LACTATED RINGERS IR SOLN
Status: DC | PRN
  Administered 2022-05-02: 1000 mL

## 2022-05-02 MED ORDER — HEPARIN SODIUM (PORCINE) 5000 UNIT/ML IJ SOLN
5000.0000 [IU] | INTRAMUSCULAR | Status: AC
  Administered 2022-05-02: 5000 [IU] via SUBCUTANEOUS
  Filled 2022-05-02: qty 1

## 2022-05-02 MED ORDER — AMISULPRIDE (ANTIEMETIC) 5 MG/2ML IV SOLN: 10.0000 mg | Freq: Once | INTRAVENOUS | Status: DC | PRN

## 2022-05-02 MED ORDER — FENTANYL CITRATE (PF) 100 MCG/2ML IJ SOLN
INTRAMUSCULAR | Status: DC | PRN
  Administered 2022-05-02 (×6): 50 ug via INTRAVENOUS
  Administered 2022-05-02 (×2): 25 ug via INTRAVENOUS

## 2022-05-02 MED ORDER — LACTATED RINGERS IV SOLN: INTRAVENOUS | Status: DC

## 2022-05-02 MED ORDER — LIDOCAINE HCL (PF) 2 % IJ SOLN
INTRAMUSCULAR | Status: AC
  Filled 2022-05-02: qty 5

## 2022-05-02 MED ORDER — KETAMINE HCL 10 MG/ML IJ SOLN
INTRAMUSCULAR | Status: DC | PRN
  Administered 2022-05-02: 25 mg via INTRAVENOUS

## 2022-05-02 MED ORDER — PROPOFOL 10 MG/ML IV BOLUS
INTRAVENOUS | Status: DC | PRN
  Administered 2022-05-02: 20 mg via INTRAVENOUS
  Administered 2022-05-02: 130 mg via INTRAVENOUS
  Administered 2022-05-02: 20 mg via INTRAVENOUS

## 2022-05-02 MED ORDER — KETAMINE HCL 50 MG/5ML IJ SOSY
PREFILLED_SYRINGE | INTRAMUSCULAR | Status: AC
  Filled 2022-05-02: qty 5

## 2022-05-02 MED ORDER — CHLORHEXIDINE GLUCONATE 0.12 % MT SOLN
15.0000 mL | Freq: Once | OROMUCOSAL | Status: AC
  Administered 2022-05-02: 15 mL via OROMUCOSAL

## 2022-05-02 MED ORDER — HYDROMORPHONE HCL 1 MG/ML IJ SOLN
0.2500 mg | INTRAMUSCULAR | Status: DC | PRN
  Administered 2022-05-02 (×2): 0.5 mg via INTRAVENOUS

## 2022-05-02 MED ORDER — OXYCODONE HCL 5 MG/5ML PO SOLN: 5.0000 mg | Freq: Once | ORAL | Status: AC | PRN

## 2022-05-02 MED ORDER — LIDOCAINE HCL (PF) 2 % IJ SOLN
INTRAMUSCULAR | Status: AC
  Filled 2022-05-02: qty 15

## 2022-05-02 MED ORDER — SCOPOLAMINE 1 MG/3DAYS TD PT72
1.0000 | MEDICATED_PATCH | TRANSDERMAL | Status: DC
  Administered 2022-05-02: 1.5 mg via TRANSDERMAL

## 2022-05-02 MED ORDER — ROCURONIUM BROMIDE 100 MG/10ML IV SOLN
INTRAVENOUS | Status: DC | PRN
  Administered 2022-05-02: 70 mg via INTRAVENOUS
  Administered 2022-05-02: 10 mg via INTRAVENOUS
  Administered 2022-05-02: 20 mg via INTRAVENOUS

## 2022-05-02 MED ORDER — ORAL CARE MOUTH RINSE: 15.0000 mL | Freq: Once | OROMUCOSAL | Status: AC

## 2022-05-02 SURGICAL SUPPLY — 76 items
APPLICATOR SURGIFLO ENDO (HEMOSTASIS) IMPLANT
BAG LAPAROSCOPIC 12 15 PORT 16 (BASKET) IMPLANT
BAG RETRIEVAL 12/15 (BASKET)
BLADE SURG SZ10 CARB STEEL (BLADE) IMPLANT
COVER BACK TABLE 60X90IN (DRAPES) ×1 IMPLANT
COVER TIP SHEARS 8 DVNC (MISCELLANEOUS) ×1 IMPLANT
COVER TIP SHEARS 8MM DA VINCI (MISCELLANEOUS) ×1
DERMABOND ADVANCED .7 DNX12 (GAUZE/BANDAGES/DRESSINGS) ×1 IMPLANT
DRAPE ARM DVNC X/XI (DISPOSABLE) ×4 IMPLANT
DRAPE COLUMN DVNC XI (DISPOSABLE) ×1 IMPLANT
DRAPE DA VINCI XI ARM (DISPOSABLE) ×4
DRAPE DA VINCI XI COLUMN (DISPOSABLE) ×1
DRAPE SHEET LG 3/4 BI-LAMINATE (DRAPES) ×1 IMPLANT
DRAPE SURG IRRIG POUCH 19X23 (DRAPES) ×1 IMPLANT
DRSG OPSITE POSTOP 4X6 (GAUZE/BANDAGES/DRESSINGS) IMPLANT
DRSG OPSITE POSTOP 4X8 (GAUZE/BANDAGES/DRESSINGS) IMPLANT
ELECT PENCIL ROCKER SW 15FT (MISCELLANEOUS) IMPLANT
ELECT REM PT RETURN 15FT ADLT (MISCELLANEOUS) ×1 IMPLANT
GAUZE 4X4 16PLY ~~LOC~~+RFID DBL (SPONGE) ×1 IMPLANT
GLOVE BIO SURGEON STRL SZ 6 (GLOVE) ×4 IMPLANT
GLOVE BIO SURGEON STRL SZ 6.5 (GLOVE) ×1 IMPLANT
GLOVE BIOGEL PI IND STRL 6.5 (GLOVE) ×2 IMPLANT
GOWN STRL REUS W/ TWL LRG LVL3 (GOWN DISPOSABLE) ×4 IMPLANT
GOWN STRL REUS W/TWL LRG LVL3 (GOWN DISPOSABLE) ×4
GRASPER SUT TROCAR 14GX15 (MISCELLANEOUS) IMPLANT
HOLDER FOLEY CATH W/STRAP (MISCELLANEOUS) IMPLANT
IRRIG SUCT STRYKERFLOW 2 WTIP (MISCELLANEOUS) ×1
IRRIGATION SUCT STRKRFLW 2 WTP (MISCELLANEOUS) ×1 IMPLANT
KIT PROCEDURE DA VINCI SI (MISCELLANEOUS)
KIT PROCEDURE DVNC SI (MISCELLANEOUS) IMPLANT
KIT TURNOVER KIT A (KITS) IMPLANT
LIGASURE IMPACT 36 18CM CVD LR (INSTRUMENTS) IMPLANT
MANIPULATOR ADVINCU DEL 3.0 PL (MISCELLANEOUS) IMPLANT
MANIPULATOR ADVINCU DEL 3.5 PL (MISCELLANEOUS) IMPLANT
MANIPULATOR UTERINE 4.5 ZUMI (MISCELLANEOUS) IMPLANT
NDL HYPO 21X1.5 SAFETY (NEEDLE) ×1 IMPLANT
NDL INSUFFLATION 14GA 120MM (NEEDLE) IMPLANT
NDL SPNL 18GX3.5 QUINCKE PK (NEEDLE) IMPLANT
NEEDLE HYPO 21X1.5 SAFETY (NEEDLE) ×1 IMPLANT
NEEDLE INSUFFLATION 14GA 120MM (NEEDLE) IMPLANT
NEEDLE SPNL 18GX3.5 QUINCKE PK (NEEDLE) IMPLANT
OBTURATOR OPTICAL STANDARD 8MM (TROCAR) ×1
OBTURATOR OPTICAL STND 8 DVNC (TROCAR) ×1
OBTURATOR OPTICALSTD 8 DVNC (TROCAR) ×1 IMPLANT
PACK ROBOT GYN CUSTOM WL (TRAY / TRAY PROCEDURE) ×1 IMPLANT
PAD ARMBOARD 7.5X6 YLW CONV (MISCELLANEOUS) ×1 IMPLANT
PAD POSITIONING PINK XL (MISCELLANEOUS) ×1 IMPLANT
PORT ACCESS TROCAR AIRSEAL 12 (TROCAR) IMPLANT
SEAL CANN UNIV 5-8 DVNC XI (MISCELLANEOUS) ×4 IMPLANT
SEAL XI 5MM-8MM UNIVERSAL (MISCELLANEOUS) ×4
SET TRI-LUMEN FLTR TB AIRSEAL (TUBING) ×1 IMPLANT
SOL PREP POV-IOD 4OZ 10% (MISCELLANEOUS) ×2 IMPLANT
SPIKE FLUID TRANSFER (MISCELLANEOUS) ×1 IMPLANT
SPONGE T-LAP 18X18 ~~LOC~~+RFID (SPONGE) IMPLANT
SURGIFLO W/THROMBIN 8M KIT (HEMOSTASIS) IMPLANT
SUT MNCRL AB 4-0 PS2 18 (SUTURE) IMPLANT
SUT PDS AB 1 TP1 96 (SUTURE) IMPLANT
SUT VIC AB 0 CT1 27 (SUTURE)
SUT VIC AB 0 CT1 27XBRD ANTBC (SUTURE) IMPLANT
SUT VIC AB 2-0 CT1 27 (SUTURE)
SUT VIC AB 2-0 CT1 TAPERPNT 27 (SUTURE) IMPLANT
SUT VIC AB 4-0 PS2 18 (SUTURE) ×2 IMPLANT
SUT VLOC 180 0 9IN  GS21 (SUTURE)
SUT VLOC 180 0 9IN GS21 (SUTURE) IMPLANT
SYR 10ML LL (SYRINGE) IMPLANT
SYS BAG RETRIEVAL 10MM (BASKET) ×1
SYS WOUND ALEXIS 18CM MED (MISCELLANEOUS)
SYSTEM BAG RETRIEVAL 10MM (BASKET) IMPLANT
SYSTEM WOUND ALEXIS 18CM MED (MISCELLANEOUS) IMPLANT
TOWEL OR NON WOVEN STRL DISP B (DISPOSABLE) IMPLANT
TRAP SPECIMEN MUCUS 40CC (MISCELLANEOUS) IMPLANT
TRAY FOLEY MTR SLVR 16FR STAT (SET/KITS/TRAYS/PACK) ×1 IMPLANT
TROCAR PORT AIRSEAL 5X120 (TROCAR) IMPLANT
UNDERPAD 30X36 HEAVY ABSORB (UNDERPADS AND DIAPERS) ×2 IMPLANT
WATER STERILE IRR 1000ML POUR (IV SOLUTION) ×1 IMPLANT
YANKAUER SUCT BULB TIP 10FT TU (MISCELLANEOUS) IMPLANT

## 2022-05-02 NOTE — Discharge Instructions (Signed)
AFTER SURGERY INSTRUCTIONS   Return to work: 4-6 weeks if applicable   Activity: 1. Be up and out of the bed during the day.  Take a nap if needed.  You may walk up steps but be careful and use the hand rail.  Stair climbing will tire you more than you think, you may need to stop part way and rest.    2. No lifting or straining for 6 weeks over 10 pounds. No pushing, pulling, straining for 6 weeks.   3. No driving for around 1 week(s).  Do not drive if you are taking narcotic pain medicine and make sure that your reaction time has returned.    4. You can shower as soon as the next day after surgery. Shower daily.  Use your regular soap and water (not directly on the incision) and pat your incision(s) dry afterwards; don't rub.  No tub baths or submerging your body in water until cleared by your surgeon. If you have the soap that was given to you by pre-surgical testing that was used before surgery, you do not need to use it afterwards because this can irritate your incisions.    5. No sexual activity and nothing in the vagina for 4 weeks, 10 weeks if you have a hysterectomy (removal of the uterus and cervix).   6. You may experience a small amount of clear drainage from your incisions, which is normal.  If the drainage persists, increases, or changes color please call the office.   7. Do not use creams, lotions, or ointments such as neosporin on your incisions after surgery until advised by your surgeon because they can cause removal of the dermabond glue on your incisions.     8. You may experience vaginal spotting after surgery or around the 6-8 week mark from surgery when the stitches at the top of the vagina begin to dissolve (if you have a hysterectomy).  The spotting is normal but if you experience heavy bleeding, call our office.   9. You can continue on your home pain medication regimen. Monitor your Tylenol intake to a max of 4,000 mg in a 24 hour period.    Diet: 1. Low sodium Heart  Healthy Diet is recommended but you are cleared to resume your normal (before surgery) diet after your procedure.   2. It is safe to use a laxative, such as Miralax or Colace, if you have difficulty moving your bowels. You have been prescribed Sennakot-S to take at bedtime every evening after surgery to keep bowel movements regular and to prevent constipation.     Wound Care: 1. Keep clean and dry.  Shower daily.   Reasons to call the Doctor: Fever - Oral temperature greater than 100.4 degrees Fahrenheit Foul-smelling vaginal discharge Difficulty urinating Nausea and vomiting Increased pain at the site of the incision that is unrelieved with pain medicine. Difficulty breathing with or without chest pain New calf pain especially if only on one side Sudden, continuing increased vaginal bleeding with or without clots.   Contacts: For questions or concerns you should contact:   Dr. [name] at [place], NP at [phone]   After Hours: call [phone] and have the GYN Oncologist paged/contacted (after 5 pm or on the weekends).   Messages sent via mychart are for non-urgent matters and are not responded to after hours so for urgent needs, please call the after hours number.

## 2022-05-02 NOTE — Telephone Encounter (Signed)
Spoke with Dr. Nolon Rod office to let them know that Ms. Cavenaugh is having surgery today. It was confirmed that Dr. Gerarda Fraction would be able to manage Ms. Goltz's post op pain.

## 2022-05-02 NOTE — Interval H&P Note (Signed)
History and Physical Interval Note:  05/02/2022 12:41 PM  Beth Jefferson  has presented today for surgery, with the diagnosis of OVARIAN MASS ELEVATED TUMOR MARKERS.  The various methods of treatment have been discussed with the patient and family. After consideration of risks, benefits and other options for treatment, the patient has consented to  Procedure(s): XI ROBOTIC ASSISTED SALPINGO OOPHORECTOMY;POSSIBLE CONTRALATERAL SALPINGO- OOPHORECTOMY (N/A) POSSIBLE XI ROBOTIC ASSISTED TOTAL HYSTERECTOMY WITH STAGING (N/A) POSSIBLE LYMPH NODE DISSECTION (N/A) as a surgical intervention.  The patient's history has been reviewed, patient examined, no change in status, stable for surgery. Endometrial biopsy was benign so we will continue with plan as above. I have reviewed the patient's chart and labs.  Questions were answered to the patient's satisfaction.     Beth Jefferson

## 2022-05-02 NOTE — Anesthesia Procedure Notes (Signed)
Procedure Name: Intubation Date/Time: 05/02/2022 1:43 PM  Performed by: Garrel Ridgel, CRNAPre-anesthesia Checklist: Patient identified, Emergency Drugs available, Suction available and Patient being monitored Patient Re-evaluated:Patient Re-evaluated prior to induction Oxygen Delivery Method: Circle system utilized Preoxygenation: Pre-oxygenation with 100% oxygen Induction Type: IV induction Ventilation: Mask ventilation without difficulty Laryngoscope Size: Mac and 3 Grade View: Grade I Tube type: Oral Tube size: 7.0 mm Number of attempts: 1 Airway Equipment and Method: Stylet and Oral airway Placement Confirmation: ETT inserted through vocal cords under direct vision, positive ETCO2 and breath sounds checked- equal and bilateral Secured at: 21 cm Tube secured with: Tape Dental Injury: Teeth and Oropharynx as per pre-operative assessment

## 2022-05-02 NOTE — Transfer of Care (Signed)
Immediate Anesthesia Transfer of Care Note  Patient: Beth Jefferson  Procedure(s) Performed: XI ROBOTIC ASSISTED LEFT SALPINGO OOPHORECTOMY;  Patient Location: PACU  Anesthesia Type:General  Level of Consciousness: oriented, sedated, and patient cooperative  Airway & Oxygen Therapy: Patient Spontanous Breathing and Patient connected to face mask oxygen  Post-op Assessment: Report given to RN and Post -op Vital signs reviewed and stable  Post vital signs: Reviewed  Last Vitals:  Vitals Value Taken Time  BP 114/91 05/02/22 1617  Temp    Pulse 68 05/02/22 1624  Resp 13 05/02/22 1624  SpO2 100 % 05/02/22 1624  Vitals shown include unvalidated device data.  Last Pain:  Vitals:   05/02/22 1054  TempSrc: Oral  PainSc: 0-No pain         Complications: No notable events documented.

## 2022-05-02 NOTE — Anesthesia Preprocedure Evaluation (Addendum)
Anesthesia Evaluation  Patient identified by MRN, date of birth, ID band Patient awake    Reviewed: Allergy & Precautions, NPO status , Patient's Chart, lab work & pertinent test results  Airway Mallampati: II  TM Distance: >3 FB Neck ROM: Full    Dental  (+) Teeth Intact, Dental Advisory Given   Pulmonary Current Smoker and Patient abstained from smoking. 20 pack year history, current smoker 1ppd No inhalers    Pulmonary exam normal breath sounds clear to auscultation       Cardiovascular negative cardio ROS Normal cardiovascular exam Rhythm:Regular Rate:Normal     Neuro/Psych  PSYCHIATRIC DISORDERS Anxiety     negative neurological ROS     GI/Hepatic negative GI ROS, Neg liver ROS,,,  Endo/Other  negative endocrine ROS    Renal/GU negative Renal ROS  negative genitourinary   Musculoskeletal negative musculoskeletal ROS (+)    Abdominal   Peds  Hematology negative hematology ROS (+) Hb 12.7, plt 235   Anesthesia Other Findings   Reproductive/Obstetrics Ovarian mass                              Anesthesia Physical Anesthesia Plan  ASA: 3  Anesthesia Plan: General   Post-op Pain Management: Tylenol PO (pre-op)* and Toradol IV (intra-op)*   Induction: Intravenous  PONV Risk Score and Plan: 3 and Ondansetron, Dexamethasone, Midazolam, Treatment may vary due to age or medical condition and Scopolamine patch - Pre-op  Airway Management Planned: Oral ETT  Additional Equipment: None  Intra-op Plan:   Post-operative Plan: Extubation in OR  Informed Consent: I have reviewed the patients History and Physical, chart, labs and discussed the procedure including the risks, benefits and alternatives for the proposed anesthesia with the patient or authorized representative who has indicated his/her understanding and acceptance.     Dental advisory given  Plan Discussed with:  CRNA  Anesthesia Plan Comments:        Anesthesia Quick Evaluation

## 2022-05-02 NOTE — Op Note (Signed)
GYNECOLOGIC ONCOLOGY OPERATIVE NOTE  Date of Service: 05/02/2022  Preoperative Diagnosis: Left complex ovarian mass  Postoperative Diagnosis: Same, benign  Procedures: Robotic-assisted laparoscopic left salpingo-oophorectomy  Surgeon: [name], MD  Assistants: Lahoma Crocker, MD  Anesthesia: General  Estimated Blood Loss: 15 mL    Fluids: 1300 ml, crystalloid  Urine Output: 175 ml, clear yellow  Findings: On entry to abdomen, normal upper abdominal survey with smooth diaphragm, liver, stomach and normal appearing omentum and bowel. Evidence of prior tubal ligation with Filshie clips bilaterally. Normal appearing right ovary. Normal small uterus. Left ovary with 6cm cystic mass, adherent to the epiploica of the rectosigmoid colon. Additional filmy adhesions of the sigmoid colon to the left lower quadrant anterior abdominal wall and left paracolic gutter. IOFS consistent with benign cyst.   Specimens:  ID Type Source Tests Collected by Time Destination  1 : Left tube and ovary Tissue PATH Gyn tumor resection SURGICAL PATHOLOGY [name], MD 05/02/2022 1437   A : Pelvic washings Body Fluid PATH Cytology Pelvic Washing CYTOLOGY - NON PAP [name], MD 03/07/5101 5852     Complications:  None  Indications for Procedure: Beth Jefferson is a 41 y.o. woman with a complex left ovarian cystic mass.  Prior to the procedure, all risks, benefits, and alternatives were discussed and informed surgical consent was signed.  Procedure: Patient was taken to the operating room where general anesthesia was achieved.  She was positioned in dorsal lithotomy and prepped and draped.  A foley catheter was inserted into the bladder.  The cervix was dilated and an advincula uterine manipulator was inserted to the uterus.    A 10 mm incision was made in the left upper quadrant near Palmer's point.  The abdomen was entered with a 5 mm OptiView trocar under direct visualization.  The  abdomen was insufflated, the patient placed in steep Trendelenburg, and additional trocars were placed as follows: an 48m robotic trocar superior to the umbilicus, one 8 mm robotic trocar in the right abdomen, and one 8 mm robotic trocar in the left abdomen.  The left upper quadrant trocar was removed and replaced with a 12 mm airseal trocar.  All trocars were placed under direct visualization.  The bowels were moved into the upper abdomen.  The DaVinci robotic surgical system was brought to the patient's bedside and docked.  Pelvic washings were obtained. Filmy adhesions of the sigmoid colon to the LLQ anterior abdominal wall and left para-colic gutter were lysed with scissors. The left retroperitoneum entered.  The left ureter was identified. A window was created in the medial leaf of the broad ligament and extended toward the cornua. The utero-ovarian ligament and fallopian tube at the cornua were isolated, cauterized, and transected.  The left adnexa was then elevated and the dense adhesions of the cystic mass to the rectosigmoid colon epiploica were lysed with blunt and sharp dissection with scissors. With the mass free from the colon, the left infundibulopelvic ligament was isolated, cauterized, and transected.  The specimen was placed into an Endo-Catch bag and brought up through the left upper quadrant incision. In the bag, the cyst was drained, and the specimen in the bag was removed and handed off the field for frozen pathology which ultimately returned benign.  The pelvis was irrigated and all operative sites were found to be hemostatic.  All instruments were removed and the robot was taken from the patient's bedside. The fascia at the 12 mm incision was closed with 0 Vicryl  using the PMI device. The abdomen was desufflated and all ports were removed.  The skin at all incisions was closed with 4-0 Vicryl to reapproximate the subcutaneous tissue and 4-0 monocryl in a subcuticular fashion followed by  surgical glue.  Patient tolerated the procedure well. Sponge, lap, and instrument counts were correct.  No perioperative antibiotic prophylaxis was indicated for this procedure.  She was extubated and taken to the PACU in stable condition.  [name], MD Gynecologic Oncology

## 2022-05-03 ENCOUNTER — Telehealth: Payer: Self-pay

## 2022-05-03 ENCOUNTER — Encounter (HOSPITAL_COMMUNITY): Payer: Self-pay | Admitting: Psychiatry

## 2022-05-03 ENCOUNTER — Telehealth: Payer: Self-pay | Admitting: *Deleted

## 2022-05-03 LAB — CYTOLOGY - NON PAP

## 2022-05-03 NOTE — Anesthesia Postprocedure Evaluation (Signed)
Anesthesia Post Note  Patient: Beth Jefferson  Procedure(s) Performed: XI ROBOTIC ASSISTED LEFT SALPINGO OOPHORECTOMY;     Patient location during evaluation: PACU Anesthesia Type: General Level of consciousness: awake and alert Pain management: pain level controlled Vital Signs Assessment: post-procedure vital signs reviewed and stable Respiratory status: spontaneous breathing, nonlabored ventilation, respiratory function stable and patient connected to nasal cannula oxygen Cardiovascular status: blood pressure returned to baseline and stable Postop Assessment: no apparent nausea or vomiting Anesthetic complications: no   No notable events documented.  Last Vitals:  Vitals:   05/02/22 1730 05/02/22 1745  BP: 127/81 128/77  Pulse: 71 71  Resp: 14   Temp:    SpO2: 98% 97%    Last Pain:  Vitals:   05/02/22 1745  TempSrc:   PainSc: [place] Clearnce Leja

## 2022-05-03 NOTE — Telephone Encounter (Signed)
Unable to connect with patient via phone for post-op call. The number rings once then disconnects.

## 2022-05-03 NOTE — Telephone Encounter (Signed)
Post op cal - no answer. Left message for pt to call office [phone] to see how she is feeling this morning

## 2022-05-04 LAB — SURGICAL PATHOLOGY

## 2022-05-04 NOTE — Telephone Encounter (Signed)
LVM for patient to call office for post-op check

## 2022-05-05 ENCOUNTER — Encounter: Payer: Self-pay | Admitting: Psychiatry

## 2022-05-05 NOTE — Telephone Encounter (Signed)
LVM for patient to call office for post-op check.

## 2022-05-05 NOTE — Telephone Encounter (Signed)
Spoke with Beth Jefferson this morning. She states she is eating, drinking and urinating well. She has not had a BM yet but is passing gas. She is taking senokot as prescribed and encouraged her to drink plenty of water. She denies fever or chills. Incisions are dry and intact. She rates her pain 6/10. Her pain is controlled with Oxycodone.     Instructed to call office with any fever, chills, purulent drainage, uncontrolled pain or any other questions or concerns. Patient verbalizes understanding.   Pt aware of post op appointments as well as the office number [phone] and after hours number [phone] to call if she has any questions or concerns

## 2022-05-08 ENCOUNTER — Inpatient Hospital Stay: Payer: Medicare HMO | Admitting: Psychiatry

## 2022-05-08 NOTE — Progress Notes (Deleted)
Gynecologic Oncology Return Clinic Visit  Date of Service: 05/08/2022 Referring Provider: [employer], [place],  [place] [address]***  Assessment & Plan: Beth Jefferson is a 41 y.o. woman with Stage *** who is *** weeks s/p *** on ***.  Postop: - Pt recovering well from surgery and healing appropriately postoperatively - Intraoperative findings and pathology results reviewed. - Ongoing postoperative expectations and precautions reviewed. Continue with no lifting >10lbs through 6 weeks postoperatively - Pt works ***. Okay to return to work at [employer] - Given that uterus is in situ, pt advised that she should continue with pap smear screening per routine until age 62 if she continues with negative/low grade paps.  - Reviewed that after 12 months without menstrual cycles, she should not have any spotting or bleeding.  If this were to occur, she should be evaluated for postmenopausal bleeding.  ***  ***VTE Prophylaxis: - Khorana score = ***  RTC ***.  [name], MD Gynecologic Oncology   Medical Decision Making I personally spent  TOTAL *** minutes face-to-face and non-face-to-face in the care of this patient, which includes all pre, intra, and post visit time on the date of service.  *** minutes spent reviewing records prior to the visit *** Minutes in patient contact      *** minutes in other billable services *** minutes charting , conferring with consultants etc.   ----------------------- Reason for Visit: Postop***  Treatment History: Oncology History   No history exists.    Interval History: Pt reports that she is recovering well from surgery. She is using *** for pain. She is eating and drinking well. She is voiding without issue and having regular bowel movements.   ***  Past Medical/Surgical History: Past Medical History:  Diagnosis Date  . ADHD (attention deficit hyperactivity disorder)   . Anxiety   . Chlamydia 07/06/2021    07/06/21 treated with doxycycline, poc________    Past Surgical History:  Procedure Laterality Date  . DILATION AND CURETTAGE OF UTERUS  1998   age 36  . ROBOTIC ASSISTED SALPINGO OOPHERECTOMY N/A 05/02/2022   Procedure: XI ROBOTIC ASSISTED LEFT SALPINGO OOPHORECTOMY;;  Surgeon: [name], MD;  Location: [facility];  Service: Gynecology;  Laterality: N/A;  . TUBAL LIGATION    . urethra stretched     childhood and in 20's    Family History  Problem Relation Age of Onset  . Macular degeneration Mother   . Healthy Father   . Bipolar disorder Sister   . Schizophrenia Sister   . COPD Brother   . Lung cancer Maternal Grandmother   . Breast cancer Maternal Grandmother   . Diabetes Paternal Grandmother   . Heart disease Paternal Grandmother   . Alcohol abuse Paternal Grandfather   . Breast cancer Paternal Aunt   . Colon cancer Maternal Aunt        Died 2/2 cancer  . Brain cancer Maternal Aunt        Died 2/2 cancer  . Ovarian cancer Neg Hx   . Endometrial cancer Neg Hx     Social History   Socioeconomic History  . Marital status: Divorced    Spouse name: Not on file  . Number of children: Not on file  . Years of education: Not on file  . Highest education level: Not on file  Occupational History  . Not on file  Tobacco Use  . Smoking status: Every Day    Packs/day: 1.00    Years: 20.00  Total pack years: 20.00    Types: Cigarettes  . Smokeless tobacco: Never  Vaping Use  . Vaping Use: Never used  Substance and Sexual Activity  . Alcohol use: Not Currently  . Drug use: Not Currently  . Sexual activity: Yes    Birth control/protection: Surgical    Comment: tubal  Other Topics Concern  . Not on file  Social History Narrative  . Not on file   Social Determinants of Health   Financial Resource Strain: Low Risk  (07/04/2021)   Overall Financial Resource Strain (CARDIA)   . Difficulty of Paying Living Expenses: Not very hard  Food Insecurity: No Food  Insecurity (07/04/2021)   Hunger Vital Sign   . Worried About Charity fundraiser in the Last Year: Never true   . Ran Out of Food in the Last Year: Never true  Transportation Needs: No Transportation Needs (07/04/2021)   PRAPARE - Transportation   . Lack of Transportation (Medical): No   . Lack of Transportation (Non-Medical): No  Physical Activity: Insufficiently Active (07/04/2021)   Exercise Vital Sign   . Days of Exercise per Week: 2 days   . Minutes of Exercise per Session: 10 min  Stress: Stress Concern Present (07/04/2021)   [place]   . Feeling of Stress : Very much  Social Connections: Moderately Isolated (07/04/2021)   Social Connection and Isolation Panel [NHANES]   . Frequency of Communication with Friends and Family: Twice a week   . Frequency of Social Gatherings with Friends and Family: Once a week   . Attends Religious Services: 1 to 4 times per year   . Active Member of Clubs or Organizations: No   . Attends Archivist Meetings: Never   . Marital Status: Divorced    Current Medications:  Current Outpatient Medications:  .  acetaminophen (TYLENOL) 500 MG tablet, Take 1,000 mg by mouth every 6 (six) hours as needed for moderate pain., Disp: , Rfl:  .  ALPRAZolam (XANAX) 0.5 MG tablet, Take 0.5 mg by mouth 3 (three) times daily as needed for anxiety., Disp: , Rfl:  .  amphetamine-dextroamphetamine (ADDERALL XR) 10 MG 24 hr capsule, Take 10 mg by mouth daily., Disp: , Rfl:  .  Cholecalciferol (VITAMIN D) 50 MCG (2000 UT) tablet, Take 2,000 Units by mouth daily., Disp: , Rfl:  .  Cyanocobalamin (VITAMIN B-12 IJ), Inject 1 Dose as directed every 30 (thirty) days., Disp: , Rfl:  .  DULoxetine (CYMBALTA) 60 MG capsule, Take 60 mg by mouth at bedtime., Disp: , Rfl:  .  HYDROcodone-acetaminophen (NORCO) 10-325 MG tablet, Take 1 tablet by mouth every 4 (four) hours as needed for severe pain., Disp: ,  Rfl:  .  naloxone (NARCAN) nasal spray 4 mg/0.1 mL, Place 1 spray into the nose as needed (opioid overdose)., Disp: , Rfl:  .  oxycodone (OXY-IR) 5 MG capsule, Take 10 mg by mouth every 4 (four) hours as needed for pain., Disp: , Rfl:  .  senna-docusate (SENOKOT-S) 8.6-50 MG tablet, Take 2 tablets by mouth at bedtime. For AFTER surgery, do not take if having diarrhea, Disp: 30 tablet, Rfl: 0  Review of Symptoms: Complete 10-system review is negative except ***as above in Interval History.  Physical Exam: There were no vitals taken for this visit. General: ***Alert, oriented, no acute distress. HEENT: ***Normocephalic, atraumatic. Neck symmetric without masses. Sclera anicteric. ***Posterior oropharynx clear. Chest: ***Normal work of breathing. ***Clear to auscultation  bilaterally.  ***Port site clean. Cardiovascular: ***Regular rate and rhythm, no murmurs. Abdomen: ***Soft, nontender.  Normoactive bowel sounds.  No masses or hepatosplenomegaly appreciated.  ***Well-healed scar. Extremities: ***Grossly normal range of motion.  Warm, well perfused.  No edema bilaterally. Skin: ***No rashes or lesions noted. Lymphatics: ***No cervical, supraclavicular, or inguinal adenopathy. GU: Normal appearing external genitalia without erythema, excoriation, or lesions.  Speculum exam reveals ***.  Bimanual exam reveals ***.  ***Rectovaginal exam ***. Exam chaperoned by ***  Laboratory & Radiologic Studies: *** This encounter was created in error - please disregard.

## 2022-05-11 ENCOUNTER — Telehealth: Payer: Self-pay | Admitting: *Deleted

## 2022-05-11 NOTE — Telephone Encounter (Signed)
Patient called and rescheduled missed appt with Dr Ernestina Patches on 12/18 to 1/3

## 2022-05-12 NOTE — Progress Notes (Signed)
No show

## 2022-05-24 ENCOUNTER — Inpatient Hospital Stay: Payer: Medicare HMO | Attending: Psychiatry | Admitting: Psychiatry

## 2022-05-24 DIAGNOSIS — N838 Other noninflammatory disorders of ovary, fallopian tube and broad ligament: Secondary | ICD-10-CM

## 2022-05-24 NOTE — Progress Notes (Signed)
No show

## 2022-06-14 DIAGNOSIS — E663 Overweight: Secondary | ICD-10-CM | POA: Diagnosis not present

## 2022-06-14 DIAGNOSIS — G894 Chronic pain syndrome: Secondary | ICD-10-CM | POA: Diagnosis not present

## 2022-06-14 DIAGNOSIS — D3910 Neoplasm of uncertain behavior of unspecified ovary: Secondary | ICD-10-CM | POA: Diagnosis not present

## 2022-06-14 DIAGNOSIS — M4306 Spondylolysis, lumbar region: Secondary | ICD-10-CM | POA: Diagnosis not present

## 2022-06-14 DIAGNOSIS — Z6827 Body mass index (BMI) 27.0-27.9, adult: Secondary | ICD-10-CM | POA: Diagnosis not present

## 2022-07-05 ENCOUNTER — Other Ambulatory Visit: Payer: Self-pay | Admitting: Obstetrics & Gynecology

## 2022-08-09 DIAGNOSIS — L0882 Omphalitis not of newborn: Secondary | ICD-10-CM | POA: Diagnosis not present

## 2022-08-09 DIAGNOSIS — D3910 Neoplasm of uncertain behavior of unspecified ovary: Secondary | ICD-10-CM | POA: Diagnosis not present

## 2022-08-09 DIAGNOSIS — E538 Deficiency of other specified B group vitamins: Secondary | ICD-10-CM | POA: Diagnosis not present

## 2022-08-09 DIAGNOSIS — M4306 Spondylolysis, lumbar region: Secondary | ICD-10-CM | POA: Diagnosis not present

## 2022-09-21 DIAGNOSIS — M4306 Spondylolysis, lumbar region: Secondary | ICD-10-CM | POA: Diagnosis not present

## 2022-09-21 DIAGNOSIS — G894 Chronic pain syndrome: Secondary | ICD-10-CM | POA: Diagnosis not present

## 2022-09-21 DIAGNOSIS — Z1331 Encounter for screening for depression: Secondary | ICD-10-CM | POA: Diagnosis not present

## 2022-09-21 DIAGNOSIS — F419 Anxiety disorder, unspecified: Secondary | ICD-10-CM | POA: Diagnosis not present

## 2022-09-21 DIAGNOSIS — Z0001 Encounter for general adult medical examination with abnormal findings: Secondary | ICD-10-CM | POA: Diagnosis not present

## 2022-09-21 DIAGNOSIS — Z6820 Body mass index (BMI) 20.0-20.9, adult: Secondary | ICD-10-CM | POA: Diagnosis not present

## 2022-09-21 DIAGNOSIS — Z9229 Personal history of other drug therapy: Secondary | ICD-10-CM | POA: Diagnosis not present

## 2022-09-21 DIAGNOSIS — E538 Deficiency of other specified B group vitamins: Secondary | ICD-10-CM | POA: Diagnosis not present

## 2022-09-21 DIAGNOSIS — R5383 Other fatigue: Secondary | ICD-10-CM | POA: Diagnosis not present

## 2022-09-21 DIAGNOSIS — D3910 Neoplasm of uncertain behavior of unspecified ovary: Secondary | ICD-10-CM | POA: Diagnosis not present

## 2022-10-24 DIAGNOSIS — G894 Chronic pain syndrome: Secondary | ICD-10-CM | POA: Diagnosis not present

## 2022-10-24 DIAGNOSIS — R102 Pelvic and perineal pain: Secondary | ICD-10-CM | POA: Diagnosis not present

## 2022-10-24 DIAGNOSIS — M4306 Spondylolysis, lumbar region: Secondary | ICD-10-CM | POA: Diagnosis not present

## 2022-10-24 DIAGNOSIS — F909 Attention-deficit hyperactivity disorder, unspecified type: Secondary | ICD-10-CM | POA: Diagnosis not present

## 2022-10-24 DIAGNOSIS — F419 Anxiety disorder, unspecified: Secondary | ICD-10-CM | POA: Diagnosis not present

## 2022-11-02 DIAGNOSIS — M542 Cervicalgia: Secondary | ICD-10-CM | POA: Diagnosis not present

## 2022-11-02 DIAGNOSIS — G894 Chronic pain syndrome: Secondary | ICD-10-CM | POA: Diagnosis not present

## 2022-11-02 DIAGNOSIS — F909 Attention-deficit hyperactivity disorder, unspecified type: Secondary | ICD-10-CM | POA: Diagnosis not present

## 2022-11-02 DIAGNOSIS — S0990XA Unspecified injury of head, initial encounter: Secondary | ICD-10-CM | POA: Diagnosis not present

## 2022-11-02 DIAGNOSIS — F419 Anxiety disorder, unspecified: Secondary | ICD-10-CM | POA: Diagnosis not present

## 2022-11-03 ENCOUNTER — Other Ambulatory Visit (HOSPITAL_COMMUNITY): Payer: Self-pay | Admitting: Internal Medicine

## 2022-11-03 DIAGNOSIS — M542 Cervicalgia: Secondary | ICD-10-CM

## 2022-11-03 DIAGNOSIS — S0990XA Unspecified injury of head, initial encounter: Secondary | ICD-10-CM

## 2022-11-14 DIAGNOSIS — G894 Chronic pain syndrome: Secondary | ICD-10-CM | POA: Diagnosis not present

## 2022-11-14 DIAGNOSIS — M542 Cervicalgia: Secondary | ICD-10-CM | POA: Diagnosis not present

## 2022-11-14 DIAGNOSIS — F909 Attention-deficit hyperactivity disorder, unspecified type: Secondary | ICD-10-CM | POA: Diagnosis not present

## 2022-11-14 DIAGNOSIS — S0990XA Unspecified injury of head, initial encounter: Secondary | ICD-10-CM | POA: Diagnosis not present

## 2022-11-14 DIAGNOSIS — F419 Anxiety disorder, unspecified: Secondary | ICD-10-CM | POA: Diagnosis not present

## 2022-11-20 ENCOUNTER — Ambulatory Visit (HOSPITAL_COMMUNITY)
Admission: RE | Admit: 2022-11-20 | Discharge: 2022-11-20 | Disposition: A | Discharge status: Home / Self Care | Payer: Medicare HMO | Source: Ambulatory Visit | Attending: Internal Medicine | Admitting: Internal Medicine

## 2022-11-20 DIAGNOSIS — S0990XA Unspecified injury of head, initial encounter: Secondary | ICD-10-CM | POA: Diagnosis not present

## 2022-11-20 DIAGNOSIS — R519 Headache, unspecified: Secondary | ICD-10-CM | POA: Diagnosis not present

## 2022-11-20 DIAGNOSIS — M542 Cervicalgia: Secondary | ICD-10-CM | POA: Diagnosis not present

## 2022-12-18 ENCOUNTER — Other Ambulatory Visit (HOSPITAL_COMMUNITY): Payer: Self-pay | Admitting: Internal Medicine

## 2022-12-18 DIAGNOSIS — Z6829 Body mass index (BMI) 29.0-29.9, adult: Secondary | ICD-10-CM | POA: Diagnosis not present

## 2022-12-18 DIAGNOSIS — M542 Cervicalgia: Secondary | ICD-10-CM

## 2022-12-18 DIAGNOSIS — F909 Attention-deficit hyperactivity disorder, unspecified type: Secondary | ICD-10-CM | POA: Diagnosis not present

## 2022-12-18 DIAGNOSIS — R42 Dizziness and giddiness: Secondary | ICD-10-CM | POA: Diagnosis not present

## 2022-12-18 DIAGNOSIS — M4306 Spondylolysis, lumbar region: Secondary | ICD-10-CM | POA: Diagnosis not present

## 2022-12-18 DIAGNOSIS — F419 Anxiety disorder, unspecified: Secondary | ICD-10-CM | POA: Diagnosis not present

## 2022-12-18 DIAGNOSIS — E6609 Other obesity due to excess calories: Secondary | ICD-10-CM | POA: Diagnosis not present

## 2022-12-18 DIAGNOSIS — S0990XA Unspecified injury of head, initial encounter: Secondary | ICD-10-CM | POA: Diagnosis not present

## 2022-12-18 DIAGNOSIS — G894 Chronic pain syndrome: Secondary | ICD-10-CM | POA: Diagnosis not present

## 2023-01-05 ENCOUNTER — Encounter (HOSPITAL_COMMUNITY): Payer: Self-pay

## 2023-01-05 ENCOUNTER — Ambulatory Visit (HOSPITAL_COMMUNITY): Payer: Medicare HMO

## 2023-01-08 ENCOUNTER — Encounter: Payer: Self-pay | Admitting: Neurology

## 2023-01-08 ENCOUNTER — Ambulatory Visit: Payer: Medicare HMO | Admitting: Neurology

## 2023-01-18 DIAGNOSIS — M4306 Spondylolysis, lumbar region: Secondary | ICD-10-CM | POA: Diagnosis not present

## 2023-01-18 DIAGNOSIS — F419 Anxiety disorder, unspecified: Secondary | ICD-10-CM | POA: Diagnosis not present

## 2023-01-18 DIAGNOSIS — G894 Chronic pain syndrome: Secondary | ICD-10-CM | POA: Diagnosis not present

## 2023-01-18 DIAGNOSIS — R102 Pelvic and perineal pain: Secondary | ICD-10-CM | POA: Diagnosis not present

## 2023-01-18 DIAGNOSIS — F909 Attention-deficit hyperactivity disorder, unspecified type: Secondary | ICD-10-CM | POA: Diagnosis not present

## 2023-02-01 ENCOUNTER — Ambulatory Visit (HOSPITAL_COMMUNITY): Admission: RE | Admit: 2023-02-01 | Payer: Medicare HMO | Source: Ambulatory Visit

## 2023-02-05 ENCOUNTER — Ambulatory Visit (HOSPITAL_COMMUNITY): Admission: RE | Admit: 2023-02-05 | Payer: Medicare HMO | Source: Ambulatory Visit

## 2023-02-06 DIAGNOSIS — S0990XA Unspecified injury of head, initial encounter: Secondary | ICD-10-CM | POA: Diagnosis not present

## 2023-02-06 DIAGNOSIS — M4306 Spondylolysis, lumbar region: Secondary | ICD-10-CM | POA: Diagnosis not present

## 2023-02-06 DIAGNOSIS — M542 Cervicalgia: Secondary | ICD-10-CM | POA: Diagnosis not present

## 2023-02-06 DIAGNOSIS — G894 Chronic pain syndrome: Secondary | ICD-10-CM | POA: Diagnosis not present

## 2023-02-08 ENCOUNTER — Encounter (HOSPITAL_COMMUNITY): Payer: Self-pay

## 2023-02-08 ENCOUNTER — Ambulatory Visit (HOSPITAL_COMMUNITY): Payer: Medicare HMO | Attending: Internal Medicine

## 2023-02-09 ENCOUNTER — Other Ambulatory Visit (HOSPITAL_COMMUNITY): Payer: Medicare HMO

## 2023-02-20 ENCOUNTER — Ambulatory Visit (HOSPITAL_COMMUNITY)
Admission: RE | Admit: 2023-02-20 | Discharge: 2023-02-20 | Disposition: A | Discharge status: Home / Self Care | Payer: Medicare HMO | Source: Ambulatory Visit | Attending: Internal Medicine | Admitting: Internal Medicine

## 2023-02-20 DIAGNOSIS — M5021 Other cervical disc displacement,  high cervical region: Secondary | ICD-10-CM | POA: Diagnosis not present

## 2023-02-20 DIAGNOSIS — M50221 Other cervical disc displacement at C4-C5 level: Secondary | ICD-10-CM | POA: Diagnosis not present

## 2023-02-20 DIAGNOSIS — M50222 Other cervical disc displacement at C5-C6 level: Secondary | ICD-10-CM | POA: Diagnosis not present

## 2023-02-20 DIAGNOSIS — M542 Cervicalgia: Secondary | ICD-10-CM | POA: Insufficient documentation

## 2023-02-20 DIAGNOSIS — M50223 Other cervical disc displacement at C6-C7 level: Secondary | ICD-10-CM | POA: Diagnosis not present

## 2023-04-09 ENCOUNTER — Ambulatory Visit: Payer: Medicare HMO | Admitting: Neurology

## 2023-04-09 ENCOUNTER — Encounter: Payer: Self-pay | Admitting: Neurology

## 2023-04-09 ENCOUNTER — Telehealth: Payer: Self-pay | Admitting: Neurology

## 2023-04-09 NOTE — Telephone Encounter (Signed)
NS for new consult. Second NS today; please review and follow dismissal protocol as per our No Show Policy.

## 2023-04-10 ENCOUNTER — Encounter: Payer: Self-pay | Admitting: Neurology

## 2023-04-11 ENCOUNTER — Other Ambulatory Visit (HOSPITAL_COMMUNITY): Payer: Self-pay | Admitting: Internal Medicine

## 2023-04-11 DIAGNOSIS — R932 Abnormal findings on diagnostic imaging of liver and biliary tract: Secondary | ICD-10-CM

## 2023-04-11 DIAGNOSIS — M542 Cervicalgia: Secondary | ICD-10-CM | POA: Diagnosis not present

## 2023-04-11 DIAGNOSIS — G894 Chronic pain syndrome: Secondary | ICD-10-CM | POA: Diagnosis not present

## 2023-04-11 DIAGNOSIS — F909 Attention-deficit hyperactivity disorder, unspecified type: Secondary | ICD-10-CM | POA: Diagnosis not present

## 2023-04-11 DIAGNOSIS — Z6831 Body mass index (BMI) 31.0-31.9, adult: Secondary | ICD-10-CM | POA: Diagnosis not present

## 2023-04-11 DIAGNOSIS — M4306 Spondylolysis, lumbar region: Secondary | ICD-10-CM | POA: Diagnosis not present

## 2023-04-11 DIAGNOSIS — E6609 Other obesity due to excess calories: Secondary | ICD-10-CM | POA: Diagnosis not present

## 2023-04-11 DIAGNOSIS — F419 Anxiety disorder, unspecified: Secondary | ICD-10-CM | POA: Diagnosis not present

## 2023-04-15 ENCOUNTER — Ambulatory Visit (HOSPITAL_COMMUNITY): Admission: RE | Admit: 2023-04-15 | Payer: Medicare HMO | Source: Ambulatory Visit

## 2023-04-15 ENCOUNTER — Encounter (HOSPITAL_COMMUNITY): Payer: Self-pay

## 2023-05-11 ENCOUNTER — Ambulatory Visit (HOSPITAL_COMMUNITY): Admission: RE | Admit: 2023-05-11 | Payer: Medicare HMO | Source: Ambulatory Visit

## 2023-05-11 DIAGNOSIS — M542 Cervicalgia: Secondary | ICD-10-CM | POA: Diagnosis not present

## 2023-05-11 DIAGNOSIS — M4306 Spondylolysis, lumbar region: Secondary | ICD-10-CM | POA: Diagnosis not present

## 2023-05-11 DIAGNOSIS — G894 Chronic pain syndrome: Secondary | ICD-10-CM | POA: Diagnosis not present

## 2023-05-11 DIAGNOSIS — K0889 Other specified disorders of teeth and supporting structures: Secondary | ICD-10-CM | POA: Diagnosis not present

## 2023-06-27 DIAGNOSIS — A084 Viral intestinal infection, unspecified: Secondary | ICD-10-CM | POA: Diagnosis not present

## 2023-06-27 DIAGNOSIS — R102 Pelvic and perineal pain: Secondary | ICD-10-CM | POA: Diagnosis not present

## 2023-06-27 DIAGNOSIS — M4306 Spondylolysis, lumbar region: Secondary | ICD-10-CM | POA: Diagnosis not present

## 2023-06-27 DIAGNOSIS — G894 Chronic pain syndrome: Secondary | ICD-10-CM | POA: Diagnosis not present

## 2023-07-20 ENCOUNTER — Ambulatory Visit: Payer: Medicare HMO | Admitting: Podiatry

## 2023-08-15 ENCOUNTER — Ambulatory Visit (HOSPITAL_COMMUNITY): Attending: Internal Medicine

## 2023-08-23 ENCOUNTER — Ambulatory Visit (INDEPENDENT_AMBULATORY_CARE_PROVIDER_SITE_OTHER): Admitting: Podiatry

## 2023-08-23 DIAGNOSIS — Z91199 Patient's noncompliance with other medical treatment and regimen due to unspecified reason: Secondary | ICD-10-CM

## 2023-08-24 DIAGNOSIS — Z9229 Personal history of other drug therapy: Secondary | ICD-10-CM | POA: Diagnosis not present

## 2023-08-24 DIAGNOSIS — R102 Pelvic and perineal pain: Secondary | ICD-10-CM | POA: Diagnosis not present

## 2023-08-24 DIAGNOSIS — E6609 Other obesity due to excess calories: Secondary | ICD-10-CM | POA: Diagnosis not present

## 2023-08-24 DIAGNOSIS — Z0001 Encounter for general adult medical examination with abnormal findings: Secondary | ICD-10-CM | POA: Diagnosis not present

## 2023-08-24 DIAGNOSIS — M542 Cervicalgia: Secondary | ICD-10-CM | POA: Diagnosis not present

## 2023-08-24 DIAGNOSIS — G894 Chronic pain syndrome: Secondary | ICD-10-CM | POA: Diagnosis not present

## 2023-08-24 DIAGNOSIS — E538 Deficiency of other specified B group vitamins: Secondary | ICD-10-CM | POA: Diagnosis not present

## 2023-08-24 DIAGNOSIS — M4306 Spondylolysis, lumbar region: Secondary | ICD-10-CM | POA: Diagnosis not present

## 2023-08-24 DIAGNOSIS — F419 Anxiety disorder, unspecified: Secondary | ICD-10-CM | POA: Diagnosis not present

## 2023-08-24 DIAGNOSIS — R5383 Other fatigue: Secondary | ICD-10-CM | POA: Diagnosis not present

## 2023-08-24 DIAGNOSIS — E559 Vitamin D deficiency, unspecified: Secondary | ICD-10-CM | POA: Diagnosis not present

## 2023-08-24 DIAGNOSIS — Z6833 Body mass index (BMI) 33.0-33.9, adult: Secondary | ICD-10-CM | POA: Diagnosis not present

## 2023-08-24 DIAGNOSIS — F909 Attention-deficit hyperactivity disorder, unspecified type: Secondary | ICD-10-CM | POA: Diagnosis not present

## 2023-08-25 NOTE — Progress Notes (Signed)
 Patient has been a no show for two consecutive appointments

## 2023-09-21 DIAGNOSIS — G894 Chronic pain syndrome: Secondary | ICD-10-CM | POA: Diagnosis not present

## 2023-09-21 DIAGNOSIS — M4306 Spondylolysis, lumbar region: Secondary | ICD-10-CM | POA: Diagnosis not present

## 2023-09-21 DIAGNOSIS — F909 Attention-deficit hyperactivity disorder, unspecified type: Secondary | ICD-10-CM | POA: Diagnosis not present

## 2023-09-21 DIAGNOSIS — M542 Cervicalgia: Secondary | ICD-10-CM | POA: Diagnosis not present

## 2023-10-22 DIAGNOSIS — R109 Unspecified abdominal pain: Secondary | ICD-10-CM | POA: Diagnosis not present

## 2023-10-22 DIAGNOSIS — M542 Cervicalgia: Secondary | ICD-10-CM | POA: Diagnosis not present

## 2023-10-22 DIAGNOSIS — M4306 Spondylolysis, lumbar region: Secondary | ICD-10-CM | POA: Diagnosis not present

## 2023-10-22 DIAGNOSIS — R102 Pelvic and perineal pain: Secondary | ICD-10-CM | POA: Diagnosis not present

## 2023-10-22 DIAGNOSIS — G894 Chronic pain syndrome: Secondary | ICD-10-CM | POA: Diagnosis not present

## 2023-11-29 ENCOUNTER — Institutional Professional Consult (permissible substitution): Admitting: Plastic Surgery

## 2023-12-24 DIAGNOSIS — F909 Attention-deficit hyperactivity disorder, unspecified type: Secondary | ICD-10-CM | POA: Diagnosis not present

## 2023-12-24 DIAGNOSIS — E6609 Other obesity due to excess calories: Secondary | ICD-10-CM | POA: Diagnosis not present

## 2023-12-24 DIAGNOSIS — E538 Deficiency of other specified B group vitamins: Secondary | ICD-10-CM | POA: Diagnosis not present

## 2023-12-24 DIAGNOSIS — M542 Cervicalgia: Secondary | ICD-10-CM | POA: Diagnosis not present

## 2023-12-24 DIAGNOSIS — Z0001 Encounter for general adult medical examination with abnormal findings: Secondary | ICD-10-CM | POA: Diagnosis not present

## 2023-12-24 DIAGNOSIS — G894 Chronic pain syndrome: Secondary | ICD-10-CM | POA: Diagnosis not present

## 2023-12-24 DIAGNOSIS — M4306 Spondylolysis, lumbar region: Secondary | ICD-10-CM | POA: Diagnosis not present

## 2023-12-24 DIAGNOSIS — Z9229 Personal history of other drug therapy: Secondary | ICD-10-CM | POA: Diagnosis not present

## 2023-12-24 DIAGNOSIS — R5383 Other fatigue: Secondary | ICD-10-CM | POA: Diagnosis not present

## 2023-12-24 DIAGNOSIS — F419 Anxiety disorder, unspecified: Secondary | ICD-10-CM | POA: Diagnosis not present

## 2023-12-24 DIAGNOSIS — E559 Vitamin D deficiency, unspecified: Secondary | ICD-10-CM | POA: Diagnosis not present

## 2023-12-24 DIAGNOSIS — Z6832 Body mass index (BMI) 32.0-32.9, adult: Secondary | ICD-10-CM | POA: Diagnosis not present

## 2024-01-02 ENCOUNTER — Institutional Professional Consult (permissible substitution): Admitting: Plastic Surgery

## 2024-01-03 ENCOUNTER — Institutional Professional Consult (permissible substitution): Admitting: Plastic Surgery

## 2024-01-25 DIAGNOSIS — M4306 Spondylolysis, lumbar region: Secondary | ICD-10-CM | POA: Diagnosis not present

## 2024-01-25 DIAGNOSIS — F419 Anxiety disorder, unspecified: Secondary | ICD-10-CM | POA: Diagnosis not present

## 2024-01-25 DIAGNOSIS — E6609 Other obesity due to excess calories: Secondary | ICD-10-CM | POA: Diagnosis not present

## 2024-01-25 DIAGNOSIS — F909 Attention-deficit hyperactivity disorder, unspecified type: Secondary | ICD-10-CM | POA: Diagnosis not present

## 2024-01-25 DIAGNOSIS — Z6832 Body mass index (BMI) 32.0-32.9, adult: Secondary | ICD-10-CM | POA: Diagnosis not present

## 2024-01-25 DIAGNOSIS — G894 Chronic pain syndrome: Secondary | ICD-10-CM | POA: Diagnosis not present

## 2024-02-05 ENCOUNTER — Telehealth: Payer: Self-pay | Admitting: Plastic Surgery

## 2024-02-05 NOTE — Telephone Encounter (Signed)
 provider out of office from 1pm to 2 pm, waiting on pt to return call to come later

## 2024-02-06 ENCOUNTER — Institutional Professional Consult (permissible substitution): Admitting: Plastic Surgery

## 2024-02-07 ENCOUNTER — Institutional Professional Consult (permissible substitution): Admitting: Plastic Surgery

## 2024-04-07 ENCOUNTER — Encounter: Payer: Self-pay | Admitting: *Deleted

## 2024-06-10 ENCOUNTER — Ambulatory Visit: Admitting: Nurse Practitioner
